# Patient Record
Sex: Male | Born: 1975
Health system: Southern US, Community
[De-identification: ages and names within clinical notes are randomized; demographics above are authoritative.]

## PROBLEM LIST (undated history)

## (undated) DIAGNOSIS — I10 Essential (primary) hypertension: Secondary | ICD-10-CM

## (undated) DIAGNOSIS — M545 Low back pain, unspecified: Secondary | ICD-10-CM

## (undated) DIAGNOSIS — T7840XA Allergy, unspecified, initial encounter: Secondary | ICD-10-CM

## (undated) DIAGNOSIS — E079 Disorder of thyroid, unspecified: Secondary | ICD-10-CM

## (undated) DIAGNOSIS — F909 Attention-deficit hyperactivity disorder, unspecified type: Secondary | ICD-10-CM

## (undated) DIAGNOSIS — E039 Hypothyroidism, unspecified: Secondary | ICD-10-CM

## (undated) DIAGNOSIS — K449 Diaphragmatic hernia without obstruction or gangrene: Secondary | ICD-10-CM

## (undated) DIAGNOSIS — E291 Testicular hypofunction: Secondary | ICD-10-CM

## (undated) DIAGNOSIS — K219 Gastro-esophageal reflux disease without esophagitis: Secondary | ICD-10-CM

## (undated) HISTORY — DX: Diaphragmatic hernia without obstruction or gangrene: K44.9

## (undated) HISTORY — DX: Allergy, unspecified, initial encounter: T78.40XA

## (undated) HISTORY — DX: Testicular hypofunction: E29.1

## (undated) HISTORY — DX: Essential (primary) hypertension: I10

## (undated) HISTORY — DX: Hypothyroidism, unspecified: E03.9

## (undated) HISTORY — DX: Low back pain: M54.5

## (undated) HISTORY — DX: Disorder of thyroid, unspecified: E07.9

## (undated) HISTORY — DX: Gastro-esophageal reflux disease without esophagitis: K21.9

## (undated) HISTORY — DX: Attention-deficit hyperactivity disorder, unspecified type: F90.9

## (undated) HISTORY — DX: Low back pain, unspecified: M54.50

---

## 2003-08-10 ENCOUNTER — Encounter: Payer: Self-pay | Admitting: Emergency Medicine

## 2003-08-10 ENCOUNTER — Emergency Department (HOSPITAL_COMMUNITY): Admission: EM | Admit: 2003-08-10 | Discharge: 2003-08-10 | Payer: Self-pay | Admitting: Emergency Medicine

## 2005-06-18 ENCOUNTER — Ambulatory Visit: Payer: Self-pay | Admitting: Family Medicine

## 2005-08-31 ENCOUNTER — Ambulatory Visit: Payer: Self-pay | Admitting: Family Medicine

## 2005-10-18 ENCOUNTER — Ambulatory Visit: Payer: Self-pay | Admitting: Family Medicine

## 2005-11-22 ENCOUNTER — Ambulatory Visit: Payer: Self-pay | Admitting: Family Medicine

## 2006-03-26 ENCOUNTER — Ambulatory Visit: Payer: Self-pay | Admitting: Family Medicine

## 2006-04-10 ENCOUNTER — Encounter: Admission: RE | Admit: 2006-04-10 | Discharge: 2006-05-14 | Payer: Self-pay | Admitting: Family Medicine

## 2006-04-10 ENCOUNTER — Ambulatory Visit: Payer: Self-pay | Admitting: Family Medicine

## 2006-06-03 ENCOUNTER — Ambulatory Visit: Payer: Self-pay | Admitting: Family Medicine

## 2006-06-05 ENCOUNTER — Encounter: Admission: RE | Admit: 2006-06-05 | Discharge: 2006-06-05 | Payer: Self-pay | Admitting: Family Medicine

## 2006-06-06 ENCOUNTER — Encounter: Admission: RE | Admit: 2006-06-06 | Discharge: 2006-06-06 | Payer: Self-pay | Admitting: Family Medicine

## 2006-06-11 ENCOUNTER — Ambulatory Visit: Payer: Self-pay | Admitting: Family Medicine

## 2006-12-18 ENCOUNTER — Ambulatory Visit: Payer: Self-pay | Admitting: Family Medicine

## 2007-04-12 ENCOUNTER — Emergency Department (HOSPITAL_COMMUNITY): Admission: EM | Admit: 2007-04-12 | Discharge: 2007-04-12 | Payer: Self-pay | Admitting: Family Medicine

## 2007-08-08 DIAGNOSIS — F411 Generalized anxiety disorder: Secondary | ICD-10-CM | POA: Insufficient documentation

## 2007-08-08 DIAGNOSIS — M545 Low back pain: Secondary | ICD-10-CM | POA: Insufficient documentation

## 2008-06-24 ENCOUNTER — Telehealth: Payer: Self-pay | Admitting: Family Medicine

## 2008-08-25 ENCOUNTER — Ambulatory Visit: Payer: Self-pay | Admitting: Family Medicine

## 2009-06-14 ENCOUNTER — Ambulatory Visit: Payer: Self-pay | Admitting: Family Medicine

## 2009-06-20 LAB — CONVERTED CEMR LAB
Alkaline Phosphatase: 62 units/L (ref 39–117)
BUN: 13 mg/dL (ref 6–23)
Basophils Relative: 0.5 % (ref 0.0–3.0)
Bilirubin, Direct: 0.1 mg/dL (ref 0.0–0.3)
CO2: 28 meq/L (ref 19–32)
Chloride: 106 meq/L (ref 96–112)
Cholesterol: 191 mg/dL (ref 0–200)
Eosinophils Absolute: 0.1 10*3/uL (ref 0.0–0.7)
Glucose, Bld: 102 mg/dL — ABNORMAL HIGH (ref 70–99)
LDL Cholesterol: 139 mg/dL — ABNORMAL HIGH (ref 0–99)
Lymphocytes Relative: 26.5 % (ref 12.0–46.0)
MCHC: 35 g/dL (ref 30.0–36.0)
Monocytes Relative: 8.8 % (ref 3.0–12.0)
Neutrophils Relative %: 62.9 % (ref 43.0–77.0)
Potassium: 3.8 meq/L (ref 3.5–5.1)
RBC: 4.72 M/uL (ref 4.22–5.81)
Specific Gravity, Urine: 1.02 (ref 1.000–1.030)
Total CHOL/HDL Ratio: 5
Total Protein, Urine: NEGATIVE mg/dL
Total Protein: 7 g/dL (ref 6.0–8.3)
Urine Glucose: NEGATIVE mg/dL
VLDL: 16.4 mg/dL (ref 0.0–40.0)
WBC: 7.6 10*3/uL (ref 4.5–10.5)

## 2009-06-28 ENCOUNTER — Ambulatory Visit: Payer: Self-pay | Admitting: Family Medicine

## 2009-06-28 DIAGNOSIS — R319 Hematuria, unspecified: Secondary | ICD-10-CM

## 2009-06-28 DIAGNOSIS — F909 Attention-deficit hyperactivity disorder, unspecified type: Secondary | ICD-10-CM | POA: Insufficient documentation

## 2009-08-01 ENCOUNTER — Ambulatory Visit: Payer: Self-pay | Admitting: Family Medicine

## 2009-08-19 ENCOUNTER — Encounter: Payer: Self-pay | Admitting: Family Medicine

## 2009-09-06 ENCOUNTER — Telehealth: Payer: Self-pay | Admitting: Family Medicine

## 2009-12-20 ENCOUNTER — Ambulatory Visit: Payer: Self-pay | Admitting: Family Medicine

## 2010-05-05 ENCOUNTER — Emergency Department (HOSPITAL_COMMUNITY): Admission: EM | Admit: 2010-05-05 | Discharge: 2010-05-05 | Payer: Self-pay | Admitting: Emergency Medicine

## 2010-05-05 ENCOUNTER — Telehealth: Payer: Self-pay | Admitting: Family Medicine

## 2010-05-15 ENCOUNTER — Ambulatory Visit: Payer: Self-pay | Admitting: Family Medicine

## 2010-05-15 DIAGNOSIS — IMO0001 Reserved for inherently not codable concepts without codable children: Secondary | ICD-10-CM

## 2010-05-15 DIAGNOSIS — R5383 Other fatigue: Secondary | ICD-10-CM

## 2010-05-15 DIAGNOSIS — R5381 Other malaise: Secondary | ICD-10-CM

## 2010-05-18 ENCOUNTER — Encounter: Payer: Self-pay | Admitting: Family Medicine

## 2010-05-18 LAB — CONVERTED CEMR LAB: EBV VCA IgG: 5.05 — ABNORMAL HIGH

## 2010-05-22 ENCOUNTER — Telehealth: Payer: Self-pay | Admitting: Family Medicine

## 2010-05-30 ENCOUNTER — Encounter: Payer: Self-pay | Admitting: Family Medicine

## 2010-07-14 ENCOUNTER — Ambulatory Visit: Payer: Self-pay | Admitting: Family Medicine

## 2010-07-14 DIAGNOSIS — E291 Testicular hypofunction: Secondary | ICD-10-CM

## 2010-07-14 DIAGNOSIS — K140 Glossitis: Secondary | ICD-10-CM

## 2010-07-20 ENCOUNTER — Encounter: Payer: Self-pay | Admitting: Family Medicine

## 2010-09-13 ENCOUNTER — Ambulatory Visit: Payer: Self-pay | Admitting: Family Medicine

## 2010-09-13 DIAGNOSIS — J069 Acute upper respiratory infection, unspecified: Secondary | ICD-10-CM

## 2010-09-13 DIAGNOSIS — M940 Chondrocostal junction syndrome [Tietze]: Secondary | ICD-10-CM

## 2010-12-07 NOTE — Progress Notes (Signed)
Summary: new Rx  Phone Note Outgoing Call   Call placed by: Raechel Ache, RN,  May 22, 2010 3:27 PM Summary of Call: report on labs. Wants to try Rx for low testosterone  [facility] [place]. Also wonders if Concerta would agree with him better than Adderall?  Follow-up for Phone Call        lets treat the low testosterone first, then work on the attention issue. Call in Androgel pump, 10 grams a day, for 6 months. recheck a level in 6 months Follow-up by: Nelwyn Salisbury MD,  May 22, 2010 5:33 PM  Additional Follow-up for Phone Call Additional follow up Details #1::        Phone call completed Additional Follow-up by: Raechel Ache, RN,  May 22, 2010 5:40 PM    New/Updated Medications: ANDROGEL PUMP 1 % GEL (TESTOSTERONE) 10gms once daily Prescriptions: ANDROGEL PUMP 1 % GEL (TESTOSTERONE) 10gms once daily  #30 days x 5   Entered by:   Raechel Ache, RN   Authorized by:   Nelwyn Salisbury MD   Signed by:   Raechel Ache, RN on 05/22/2010   Method used:   Historical   RxID:   [id number]

## 2010-12-07 NOTE — Assessment & Plan Note (Signed)
Summary: fup on meds//ccm   Vital Signs:  Patient profile:   35 year old male Weight:      170 pounds Temp:     98.3 degrees F oral Pulse rate:   84 / minute BP sitting:   110 / 82  (left arm) Cuff size:   large  Vitals Entered By: Alfred Levins, CMA (December 20, 2009 1:25 PM) CC: renew med, weak in arms   History of Present Illness: Here to follow up on ADHD. When we went to the 30 mg dose, he says this works much better for him. However he seems to hit a wall in the mid afternoon, and he feels as if the medication wears off.   Allergies: 1)  ! Erythromycin  Past History:  Past Medical History: Reviewed history from 06/28/2009 and no changes required. fractured right elbow Anxiety Low back pain ADHD hematuria, has seen a Urologist in [place]  Review of Systems  The patient denies anorexia, fever, weight loss, weight gain, vision loss, decreased hearing, hoarseness, chest pain, syncope, dyspnea on exertion, peripheral edema, prolonged cough, headaches, hemoptysis, abdominal pain, melena, hematochezia, severe indigestion/heartburn, hematuria, incontinence, genital sores, muscle weakness, suspicious skin lesions, transient blindness, difficulty walking, depression, unusual weight change, abnormal bleeding, enlarged lymph nodes, angioedema, breast masses, and testicular masses.    Physical Exam  General:  Well-developed,well-nourished,in no acute distress; alert,appropriate and cooperative throughout examination Psych:  Cognition and judgment appear intact. Alert and cooperative with normal attention span and concentration. No apparent delusions, illusions, hallucinations   Impression & Recommendations:  Problem # 1:  ADHD (ICD-314.01)  Complete Medication List: 1)  Adderall Xr 30 Mg Xr24h-cap (Amphetamine-dextroamphetamine) .... Two times a day, may fill on 02-17-10  Patient Instructions: 1)  Increase the Adderall XR to two times a day ,  2)  Please schedule a  follow-up appointment in 3 months .  Prescriptions: ADDERALL XR 30 MG XR24H-CAP (AMPHETAMINE-DEXTROAMPHETAMINE) two times a day, may fill on 02-17-10  #60 x 0   Entered and Authorized by:   Nelwyn Salisbury MD   Signed by:   Nelwyn Salisbury MD on 12/20/2009   Method used:   Print then Give to Patient   RxID:   [id number] ADDERALL XR 30 MG XR24H-CAP (AMPHETAMINE-DEXTROAMPHETAMINE) two times a day, may fill on 01-17-10  #60 x 0   Entered and Authorized by:   Nelwyn Salisbury MD   Signed by:   Nelwyn Salisbury MD on 12/20/2009   Method used:   Print then Give to Patient   RxID:   [id number] ADDERALL XR 30 MG XR24H-CAP (AMPHETAMINE-DEXTROAMPHETAMINE) two times a day  #60 x 0   Entered and Authorized by:   Nelwyn Salisbury MD   Signed by:   Nelwyn Salisbury MD on 12/20/2009   Method used:   Print then Give to Patient   RxID:   [id number] ADDERALL XR 30 MG XR24H-CAP (AMPHETAMINE-DEXTROAMPHETAMINE) once daily  #60 x 0   Entered and Authorized by:   Nelwyn Salisbury MD   Signed by:   Nelwyn Salisbury MD on 12/20/2009   Method used:   Print then Give to Patient   RxID:   [id number]

## 2010-12-07 NOTE — Assessment & Plan Note (Signed)
Summary: Post er visit/dm   Vital Signs:  Patient profile:   35 year old male Weight:      169 pounds BMI:     28.01 BP sitting:   106 / 76  (left arm) Cuff size:   regular  Vitals Entered By: Raechel Ache, RN (May 15, 2010 4:25 PM) CC: ER f/u; taking Adderall once daily.   History of Present Illness: Here to follow up after an ER visit on 05-05-10 for blurred vision, mouth sores, and generalized weakness. His workup was totally normal, including labs, EKG, head CT and brain MRI. No etiology was found, and was told to follow up with [place]. He had some mild versions of these symptoms for about 2 weeks, but they rapidly worsened during the 24 hours prior to his ER visit. Since then he has felt much better, though he still has some generalized weakness and some mild generalized myalgias. No fever or rashes. No changes in urinations or BMs. he has been on Adderall for about a year, so I do not think this relates at all to his symptoms. No chest pains or SPOB. No HAs.   Allergies: 1)  ! Erythromycin  Past History:  Past Medical History: Reviewed history from 06/28/2009 and no changes required. fractured right elbow Anxiety Low back pain ADHD hematuria, has seen a Urologist in [place]  Past Surgical History: Reviewed history from 08/08/2007 and no changes required. Denies surgical history  Review of Systems  The patient denies anorexia, fever, weight loss, weight gain, vision loss, decreased hearing, hoarseness, chest pain, syncope, dyspnea on exertion, peripheral edema, prolonged cough, headaches, hemoptysis, abdominal pain, melena, hematochezia, severe indigestion/heartburn, hematuria, incontinence, genital sores, suspicious skin lesions, transient blindness, difficulty walking, depression, unusual weight change, abnormal bleeding, enlarged lymph nodes, angioedema, breast masses, and testicular masses.    Physical Exam  General:  Well-developed,well-nourished,in no acute  distress; alert,appropriate and cooperative throughout examination Head:  Normocephalic and atraumatic without obvious abnormalities. No apparent alopecia or balding. Eyes:  No corneal or conjunctival inflammation noted. EOMI. Perrla. Funduscopic exam benign, without hemorrhages, exudates or papilledema. Vision grossly normal. Ears:  External ear exam shows no significant lesions or deformities.  Otoscopic examination reveals clear canals, tympanic membranes are intact bilaterally without bulging, retraction, inflammation or discharge. Hearing is grossly normal bilaterally. Nose:  External nasal examination shows no deformity or inflammation. Nasal mucosa are pink and moist without lesions or exudates. Mouth:  Oral mucosa and oropharynx without lesions or exudates.  Teeth in good repair. Neck:  No deformities, masses, or tenderness noted. Lungs:  Normal respiratory effort, chest expands symmetrically. Lungs are clear to auscultation, no crackles or wheezes. Heart:  Normal rate and regular rhythm. S1 and S2 normal without gallop, murmur, click, rub or other extra sounds. Abdomen:  Bowel sounds positive,abdomen soft and non-tender without masses, organomegaly or hernias noted. Extremities:  No clubbing, cyanosis, edema, or deformity noted with normal full range of motion of all joints.   Neurologic:  No cranial nerve deficits noted. Station and gait are normal. Plantar reflexes are down-going bilaterally. DTRs are symmetrical throughout. Sensory, motor and coordinative functions appear intact. Skin:  Intact without suspicious lesions or rashes Psych:  Oriented X3, memory intact for recent and remote, normally interactive, and good eye contact.     Impression & Recommendations:  Problem # 1:  WEAKNESS (ICD-780.79)  Orders: Venipuncture (16109) TLB-TSH (Thyroid Stimulating Hormone) (84443-TSH) TLB-Sedimentation Rate (ESR) (85652-ESR) TLB-Rheumatoid Factor (RA) (60454-UJ) T-Lyme Disease  (81191-47829) T-Vitamin D (  25-Hydroxy) (16109-60454) T- * Misc. Laboratory test [phone]) TLB-Testosterone, Total (84403-TESTO)  Problem # 2:  MYALGIA (ICD-729.1)  Complete Medication List: 1)  Adderall Xr 30 Mg Xr24h-cap (Amphetamine-dextroamphetamine) .... Two times a day, may fill on 02-17-10  Patient Instructions: 1)  It is still unclear what the etiology of these symptoms may be. He definitely feels better now than he did a few days ago. He wants to return to work tomorrow, and I gave him the okay to do that. We will get more labs today to investigate other possible etiologies. He will return if anything changes.   Appended Document: Orders Update    Clinical Lists Changes  Orders: Added new Test order of T- * Misc. Laboratory test [phone]) - Signed

## 2010-12-07 NOTE — Medication Information (Signed)
Summary: Androderm Approved  Androderm Approved   Imported By: Maryln Gottron 05/31/2010 12:40:37  _____________________________________________________________________  External Attachment:    Type:   Image     Comment:   External Document

## 2010-12-07 NOTE — Progress Notes (Signed)
Summary: phone  Phone Note Call from Patient   Caller: Spouse Summary of Call: wife had to pick husband up from work- weak and can't raise arms. Told to go to ER now per dr Clent Ridges. 8:30am Initial call taken by: Raechel Ache, RN,  May 05, 2010 9:33 AM

## 2010-12-07 NOTE — Consult Note (Signed)
Summary: [facility]  [facility]   Imported By: Maryln Gottron 07/28/2010 14:58:54  _____________________________________________________________________  External Attachment:    Type:   Image     Comment:   External Document

## 2010-12-07 NOTE — Assessment & Plan Note (Signed)
Summary: pneumonia?/dm   Vital Signs:  Patient profile:   35 year old male Weight:      177 pounds O2 Sat:      98 % Temp:     99 degrees F Pulse rate:   104 / minute BP sitting:   112 / 80  (left arm) Cuff size:   regular  Vitals Entered By: Pura Spice, RN (September 13, 2010 3:40 PM) CC: c/o SOB pain left lateral chest and coughing    History of Present Illness: Here for 4 days of a dry cough and some diffuse sharp pains around the chest when he takes a deep breath. No fever. No SOB. Some runny nose and sneezing. His daughter is being treated for  viral pneumonia right now.   Allergies: 1)  ! Erythromycin  Past History:  Past Medical History: Reviewed history from 07/14/2010 and no changes required. fractured right elbow Anxiety Low back pain ADHD hematuria, has seen a Urologist in [place] hypogonadism  Past Surgical History: Reviewed history from 08/08/2007 and no changes required. Denies surgical history  Review of Systems  The patient denies anorexia, fever, weight loss, weight gain, vision loss, decreased hearing, hoarseness, chest pain, syncope, dyspnea on exertion, peripheral edema, headaches, hemoptysis, abdominal pain, melena, hematochezia, severe indigestion/heartburn, hematuria, incontinence, genital sores, muscle weakness, suspicious skin lesions, transient blindness, difficulty walking, depression, unusual weight change, abnormal bleeding, enlarged lymph nodes, angioedema, breast masses, and testicular masses.    Physical Exam  General:  Well-developed,well-nourished,in no acute distress; alert,appropriate and cooperative throughout examination Head:  Normocephalic and atraumatic without obvious abnormalities. No apparent alopecia or balding. Eyes:  No corneal or conjunctival inflammation noted. EOMI. Perrla. Funduscopic exam benign, without hemorrhages, exudates or papilledema. Vision grossly normal. Ears:  External ear exam shows no significant  lesions or deformities.  Otoscopic examination reveals clear canals, tympanic membranes are intact bilaterally without bulging, retraction, inflammation or discharge. Hearing is grossly normal bilaterally. Nose:  External nasal examination shows no deformity or inflammation. Nasal mucosa are pink and moist without lesions or exudates. Mouth:  Oral mucosa and oropharynx without lesions or exudates.  Teeth in good repair. Neck:  No deformities, masses, or tenderness noted. Chest Wall:  diffusely tender around the whole chest Lungs:  Normal respiratory effort, chest expands symmetrically. Lungs are clear to auscultation, no crackles or wheezes. Heart:  Normal rate and regular rhythm. S1 and S2 normal without gallop, murmur, click, rub or other extra sounds.   Impression & Recommendations:  Problem # 1:  VIRAL URI (ICD-465.9)  Problem # 2:  COSTOCHONDRITIS (ICD-733.6)  Complete Medication List: 1)  Adderall Xr 30 Mg Xr24h-cap (Amphetamine-dextroamphetamine) .... Two times a day 2)  Androgel Pump 1 % Gel (Testosterone) .Marland Kitchen.. 10gms once daily 3)  Prednisone (pak) 10 Mg Tabs (Prednisone) .... As directed for 12 days  Patient Instructions: 1)  Please schedule a follow-up appointment as needed .  Prescriptions: PREDNISONE (PAK) 10 MG TABS (PREDNISONE) as directed for 12 days  #1 x 0   Entered and Authorized by:   Nelwyn Salisbury MD   Signed by:   Nelwyn Salisbury MD on 09/13/2010   Method used:   Electronically to        [facility]  [phone]* (retail)       [address]       [place], [place]  [address]       Ph: [phone] or [phone]  Fax: [phone]   RxID:   [id number]    Orders Added: 1)  Est. Patient Level IV [30865]

## 2010-12-07 NOTE — Assessment & Plan Note (Signed)
Summary: fup on meds---mouth issues//ccm   Vital Signs:  Patient profile:   35 year old male Weight:      171 pounds O2 Sat:      98 % Temp:     99.1 degrees F Pulse rate:   100 / minute BP sitting:   130 / 82  (left arm)  Vitals Entered By: Pura Spice, RN (July 14, 2010 11:42 AM) CC: RENEW ADDERALL AND CK MOUTH   History of Present Illness: Here to refill his Adderall, and also to discuss continuing burning pains in the tongue which have persisted for several years. This soreness in the tongue has been worse over the past few months, but no cause has been found. He saw his dentist, who could not find a cause. We recently checked numerous labs, including a ESR, which were normal. His testosterone level was low, and we have begun to supplement this, but this would not explain the sensations in his togue. He has tried [employer] and several courses of Valtrex with no results. No ST, no visible rashes or lesions.   Allergies: 1)  ! Erythromycin  Past History:  Past Medical History: fractured right elbow Anxiety Low back pain ADHD hematuria, has seen a Urologist in [place] hypogonadism  Past Surgical History: Reviewed history from 08/08/2007 and no changes required. Denies surgical history  Review of Systems  The patient denies anorexia, fever, weight loss, weight gain, vision loss, decreased hearing, hoarseness, chest pain, syncope, dyspnea on exertion, peripheral edema, prolonged cough, headaches, hemoptysis, abdominal pain, melena, hematochezia, severe indigestion/heartburn, hematuria, incontinence, genital sores, muscle weakness, suspicious skin lesions, transient blindness, difficulty walking, depression, unusual weight change, abnormal bleeding, enlarged lymph nodes, angioedema, breast masses, and testicular masses.    Physical Exam  General:  Well-developed,well-nourished,in no acute distress; alert,appropriate and cooperative throughout examination Head:   Normocephalic and atraumatic without obvious abnormalities. No apparent alopecia or balding. Eyes:  No corneal or conjunctival inflammation noted. EOMI. Perrla. Funduscopic exam benign, without hemorrhages, exudates or papilledema. Vision grossly normal. Ears:  External ear exam shows no significant lesions or deformities.  Otoscopic examination reveals clear canals, tympanic membranes are intact bilaterally without bulging, retraction, inflammation or discharge. Hearing is grossly normal bilaterally. Nose:  External nasal examination shows no deformity or inflammation. Nasal mucosa are pink and moist without lesions or exudates. Mouth:  Oral mucosa and oropharynx without lesions or exudates.  Teeth in good repair. Neck:  No deformities, masses, or tenderness noted. Neurologic:  No cranial nerve deficits noted. Station and gait are normal. Plantar reflexes are down-going bilaterally. DTRs are symmetrical throughout. Sensory, motor and coordinative functions appear intact. Cervical Nodes:  No lymphadenopathy noted Psych:  Cognition and judgment appear intact. Alert and cooperative with normal attention span and concentration. No apparent delusions, illusions, hallucinations   Impression & Recommendations:  Problem # 1:  ADHD (ICD-314.01)  Problem # 2:  HYPOGONADISM (ICD-257.2)  Problem # 3:  GLOSSITIS (ICD-529.0)  Orders: ENT Referral (ENT)  Complete Medication List: 1)  Adderall Xr 30 Mg Xr24h-cap (Amphetamine-dextroamphetamine) .... Two times a day 2)  Androgel Pump 1 % Gel (Testosterone) .Marland Kitchen.. 10gms once daily  Patient Instructions: 1)  Continue Androgel daily. Refilled Adderall as before. I cannot explain the sensations in his tongue, so we will refer to ENT.  Prescriptions: ADDERALL XR 30 MG XR24H-CAP (AMPHETAMINE-DEXTROAMPHETAMINE) two times a day  #60 x 0   Entered and Authorized by:   Nelwyn Salisbury MD   Signed by:  Nelwyn Salisbury MD on 07/14/2010   Method used:   Print then  Give to Patient   RxID:   [id number] ADDERALL XR 30 MG XR24H-CAP (AMPHETAMINE-DEXTROAMPHETAMINE) two times a day, may fill on 09-13-10  #60 x 0   Entered and Authorized by:   Nelwyn Salisbury MD   Signed by:   Nelwyn Salisbury MD on 07/14/2010   Method used:   Print then Give to Patient   RxID:   [id number] ADDERALL XR 30 MG XR24H-CAP (AMPHETAMINE-DEXTROAMPHETAMINE) two times a day, may fill on 08-13-10  #60 x 0   Entered and Authorized by:   Nelwyn Salisbury MD   Signed by:   Nelwyn Salisbury MD on 07/14/2010   Method used:   Print then Give to Patient   RxID:   [id number]

## 2010-12-18 ENCOUNTER — Encounter (INDEPENDENT_AMBULATORY_CARE_PROVIDER_SITE_OTHER): Payer: Self-pay | Admitting: *Deleted

## 2010-12-22 ENCOUNTER — Ambulatory Visit: Payer: Self-pay | Admitting: Gastroenterology

## 2010-12-27 NOTE — Letter (Signed)
Summary: New Patient letter  [facility]  [address] [place], [place] [address]   Phone: [phone]  Fax: [phone]       12/18/2010 MRN: [id number]  Patrick Brock [address] [place], [place]  [address]  Dear [name],  Welcome to the Gastroenterology Division at [facility]    You are scheduled to see Dr.  Jarold Motto on 12-22-10 at 9:30A.M. on the 3rd floor at [facility], [address]  We ask that you try to arrive at our office 15 minutes prior to your appointment time to allow for check-in.  We would like you to complete the enclosed self-administered evaluation form prior to your visit and bring it with you on the day of your appointment.  We will review it with you.  Also, please bring a complete list of all your medications or, if you prefer, bring the medication bottles and we will list them.  Please bring your insurance card so that we may make a copy of it.  If your insurance requires a referral to see a specialist, please bring your referral form from your primary care physician.  Co-payments are due at the time of your visit and may be paid by cash, check or credit card.     Your office visit will consist of a consult with your physician (includes a physical exam), any laboratory testing he/she may order, scheduling of any necessary diagnostic testing (e.g. x-ray, ultrasound, CT-scan), and scheduling of a procedure (e.g. Endoscopy, Colonoscopy) if required.  Please allow enough time on your schedule to allow for any/all of these possibilities.    If you cannot keep your appointment, please call [phone] to cancel or reschedule prior to your appointment date.  This allows [place] the opportunity to schedule an appointment for another patient in need of care.  If you do not cancel or reschedule by 5 p.m. the business day prior to your appointment date, you will be charged a $50.00 late cancellation/no-show fee.    Thank you for choosing  [facility] for your medical needs.  We appreciate the opportunity to care for you.  Please visit [place] at our website  to learn more about our practice.                     Sincerely,                                                             The Gastroenterology Division

## 2011-01-21 LAB — DIFFERENTIAL
Basophils Absolute: 0 10*3/uL (ref 0.0–0.1)
Basophils Relative: 0 % (ref 0–1)
Lymphocytes Relative: 33 % (ref 12–46)
Neutro Abs: 3.8 10*3/uL (ref 1.7–7.7)
Neutrophils Relative %: 58 % (ref 43–77)

## 2011-01-21 LAB — URINE MICROSCOPIC-ADD ON

## 2011-01-21 LAB — LIPASE, BLOOD: Lipase: 27 U/L (ref 11–59)

## 2011-01-21 LAB — URINALYSIS, ROUTINE W REFLEX MICROSCOPIC
Nitrite: NEGATIVE
Protein, ur: NEGATIVE mg/dL
Specific Gravity, Urine: 1.012 (ref 1.005–1.030)
Urobilinogen, UA: 0.2 mg/dL (ref 0.0–1.0)

## 2011-01-21 LAB — CBC
HCT: 40.1 % (ref 39.0–52.0)
Hemoglobin: 13.9 g/dL (ref 13.0–17.0)
RBC: 4.39 MIL/uL (ref 4.22–5.81)
WBC: 6.5 10*3/uL (ref 4.0–10.5)

## 2011-01-21 LAB — COMPREHENSIVE METABOLIC PANEL
BUN: 11 mg/dL (ref 6–23)
CO2: 28 mEq/L (ref 19–32)
Chloride: 107 mEq/L (ref 96–112)
Creatinine, Ser: 0.93 mg/dL (ref 0.4–1.5)
GFR calc non Af Amer: >60 mL/min (ref >60)
Glucose, Bld: 94 mg/dL (ref 70–99)
Total Bilirubin: 0.5 mg/dL (ref 0.3–1.2)

## 2011-01-21 LAB — ETHANOL: Alcohol, Ethyl (B): <5 mg/dL (ref 0–10)

## 2011-01-25 ENCOUNTER — Other Ambulatory Visit (INDEPENDENT_AMBULATORY_CARE_PROVIDER_SITE_OTHER): Payer: BC Managed Care – PPO

## 2011-01-25 ENCOUNTER — Ambulatory Visit (INDEPENDENT_AMBULATORY_CARE_PROVIDER_SITE_OTHER): Payer: BC Managed Care – PPO | Admitting: Gastroenterology

## 2011-01-25 ENCOUNTER — Encounter: Payer: Self-pay | Admitting: Gastroenterology

## 2011-01-25 VITALS — BP 124/74 | HR 88 | Ht 67.0 in | Wt 187.0 lb

## 2011-01-25 DIAGNOSIS — K219 Gastro-esophageal reflux disease without esophagitis: Secondary | ICD-10-CM

## 2011-01-25 LAB — FERRITIN: Ferritin: 31.7 ng/mL (ref 22.0–322.0)

## 2011-01-25 LAB — CBC WITH DIFFERENTIAL/PLATELET
Basophils Absolute: 0 10*3/uL (ref 0.0–0.1)
Eosinophils Relative: 1.1 % (ref 0.0–5.0)
HCT: 44 % (ref 39.0–52.0)
Lymphs Abs: 1.8 10*3/uL (ref 0.7–4.0)
MCV: 90.5 fl (ref 78.0–100.0)
Monocytes Absolute: 0.5 10*3/uL (ref 0.1–1.0)
Neutrophils Relative %: 63.3 % (ref 43.0–77.0)
Platelets: 214 10*3/uL (ref 150.0–400.0)
RDW: 12.7 % (ref 11.5–14.6)
WBC: 6.7 10*3/uL (ref 4.5–10.5)

## 2011-01-25 LAB — BASIC METABOLIC PANEL
BUN: 13 mg/dL (ref 6–23)
CO2: 26 mEq/L (ref 19–32)
Calcium: 9.1 mg/dL (ref 8.4–10.5)
Creatinine, Ser: 1 mg/dL (ref 0.4–1.5)

## 2011-01-25 LAB — HEPATIC FUNCTION PANEL
ALT: 36 U/L (ref 0–53)
Bilirubin, Direct: 0.1 mg/dL (ref 0.0–0.3)
Total Bilirubin: 0.6 mg/dL (ref 0.3–1.2)

## 2011-01-25 LAB — IBC PANEL
Iron: 111 ug/dL (ref 42–165)
Saturation Ratios: 28.4 % (ref 20.0–50.0)
Transferrin: 278.7 mg/dL (ref 212.0–360.0)

## 2011-01-25 LAB — VITAMIN B12: Vitamin B-12: 600 pg/mL (ref 211–911)

## 2011-01-25 LAB — TSH: TSH: 3.63 u[IU]/mL (ref 0.35–5.50)

## 2011-01-25 LAB — IGA: IgA: 335 mg/dL (ref 68–378)

## 2011-01-25 LAB — FOLATE: Folate: 13.6 ng/mL (ref >5.9)

## 2011-01-25 MED ORDER — ONE-DAILY MULTI VITAMINS PO TABS: 1.0000 | ORAL_TABLET | Freq: Every day | ORAL | Status: DC

## 2011-01-25 MED ORDER — DEXLANSOPRAZOLE 60 MG PO CPDR: 60.0000 mg | DELAYED_RELEASE_CAPSULE | Freq: Every day | ORAL | Status: AC

## 2011-01-25 MED ORDER — VITAMIN C 500 MG PO TABS: 500.0000 mg | ORAL_TABLET | Freq: Every day | ORAL | Status: DC

## 2011-01-25 MED ORDER — ZINC SULFATE 220 (50 ZN) MG PO CAPS: 220.0000 mg | ORAL_CAPSULE | Freq: Every day | ORAL | Status: DC

## 2011-01-25 NOTE — Progress Notes (Signed)
History of Present Illness:  This is a  35 year old Caucasian male for by Dr. Shellia Carwin  For her one-year history of atypical mouth and tongue soreness unexplained etiology despite multiple empiric treatments for fungal and viral  Infections. He has had several ENT evaluations which have been unremarkable. He does have some occasional reflux symptoms with regurgitation and burning substernal pain apparently had an upper GI series some 7-8 years ago for reasons which are unclear. No history of dysphagia, painful swallowing, or hepatobiliary complaints. He denies frequent use of antibiotics, prednisone, or any history of immune deficiency. Is not had previous endoscopic exams. Seen by urology for microscopic hematuria but he denies any symptoms of urethritis , skin rashes, or joint swellings. Lab review shows a normal rheumatoid factor. Family history is noncontributory.    he has mild constipation but does not use laxatives, 9 melena or hematochezia. There is no history of hepatitis, pancreatitis, anorexia, weight loss, or systemic complaints.He was on Adderal  Over the last year for possible ADHD. He feels this may have caused his dry mouth syndrome problems, and has discontinued this medication. He denies any anxiety or depression symptomatology. ROS: The remainder of the 8 point ROS is negative.  he does have allergic sinusitis , chronic fatigue, nonspecific headaches, and nonspecific myalgias. There is no history of any cardiopulmonary her other ENT symptomatology. Follows a regular diet and denies any specific food intolerances.   Past Medical History  Diagnosis Date  . Anxiety states   . ADHD (attention deficit hyperactivity disorder)   . Hypogonadism male   . Low back pain   . Hypertension    History reviewed. No pertinent past surgical history.  reports that he has never smoked. He does not have any smokeless tobacco history on file. He reports that he does not drink alcohol or use illicit  drugs. family history is negative for Colon cancer. Allergies  Allergen Reactions  . Erythromycin        Physical Exam: General well developed well nourished patient in no acute distress, appearing their stated age Eyes PERRLA, no icterus fundoscopic exam per opthamologist Skin no lesions noted Neck supple, no adenopathy, no thyroid enlargement, no tenderness Chest clear to percussion and auscultation Heart no significant murmurs, gallops or rubs noted Abdomen no hepatosplenomegaly masses or tenderness, BS normal.   Extremities no acute joint lesions, edema, phlebitis or evidence of cellulitis. Neurologic patient oriented x 3, cranial nerves intact, no focal neurologic deficits noted. Psychological mental status normal and normal affect.  inspection of the oropharyngeal area and tong is basically unremarkable.  assessment and plan:  I suspect he has a typical symptoms of acid reflux. Other considerations would be possible Reiter's  Syndrome , zinc , vitamin C, vitamin B deficiencies. I decided to treat him for acid reflux with Dexilant  60 mg every morning with standard antireflux maneuvers. As shown our patient education video   concerning GERD management. I have asked him to begin a multivitamin with vitamin B, 500 mg of vitamin C a day, and also a Zinc  Supplement. I have ordered screening labs including serum zinc level, magnesium level, celiac antibodies, CBC and metabolic profile. Would agree that he needs to discontinue Adderall. We'll try to obtain records concerning his ENT evaluation. Otherwise I've asked him to see me in 6 weeks' time for followup. He may need endoscopic exam and 24-hour pH probe testing.

## 2011-01-25 NOTE — Patient Instructions (Signed)
Today you have watched the Movie on [employer].  You were given a [employer] today.  Your prescription(s) have been sent to you pharmacy.  Please go to the basement today for your labs.

## 2011-01-26 ENCOUNTER — Encounter: Payer: Self-pay | Admitting: *Deleted

## 2011-01-26 DIAGNOSIS — K219 Gastro-esophageal reflux disease without esophagitis: Secondary | ICD-10-CM

## 2011-01-26 LAB — GLIA (IGA/G) + TTG IGA
Deamidated Gliadin Abs, IgG: 7.3 U/mL
Gliadin IgA: 6.2 U/mL
Tissue Transglutaminase Ab, IgA: 12.4 U/mL

## 2011-01-30 ENCOUNTER — Encounter: Payer: Self-pay | Admitting: Family Medicine

## 2011-01-30 ENCOUNTER — Ambulatory Visit (INDEPENDENT_AMBULATORY_CARE_PROVIDER_SITE_OTHER): Payer: BC Managed Care – PPO | Admitting: Family Medicine

## 2011-01-30 VITALS — BP 136/84 | HR 85 | Temp 98.5°F

## 2011-01-30 DIAGNOSIS — K121 Other forms of stomatitis: Secondary | ICD-10-CM

## 2011-01-30 DIAGNOSIS — F909 Attention-deficit hyperactivity disorder, unspecified type: Secondary | ICD-10-CM

## 2011-01-30 MED ORDER — LISDEXAMFETAMINE DIMESYLATE 40 MG PO CAPS: 40.0000 mg | ORAL_CAPSULE | ORAL | Status: DC

## 2011-01-30 NOTE — Progress Notes (Signed)
  Subjective:    Patient ID: Patrick Brock, male    DOB: [birth year], 35 y.o.   MRN: [id number]  HPI Here to discuss recurrent problems of soreness in the mouth and swollen neck nodes which have bothered him for months. As noted in the chart, he has had thorough workups with ENT and myself, and he recently saw Dr. Jarold Motto for a GI evaluation. He felt that Domanique may have some GERD problems, si he started him on Dexilant. Roney had stopped taking Adderall for several weeks to see if this could be the problem, and in fact the mouth issues resolved. Then 2 days ago he decided to try the Adderall again. Within 4 hours of taking the first dose the same mouth and throat symptoms returned. Now he has stopped it again, and the symptoms are fading a bit. Also he noticed a sharp pain in the left side of the tongue and throat yesterday which is the same today. No fever or URI symptoms.    Review of Systems  Constitutional: Negative.   HENT: Positive for sore throat.   Eyes: Negative.   Respiratory: Negative.        Objective:   Physical Exam  Constitutional: He appears well-developed and well-nourished. No distress.  HENT:  Head: Normocephalic and atraumatic.  Right Ear: External ear normal.  Left Ear: External ear normal.  Nose: Nose normal.  Mouth/Throat: No oropharyngeal exudate.       There is a large aphthous ulcer on the left side of the tongue   Eyes: Conjunctivae and EOM are normal. Pupils are equal, round, and reactive to light. Right eye exhibits no discharge. Left eye exhibits no discharge.  Neck: Normal range of motion. Neck supple. No thyromegaly present.  Lymphadenopathy:    He has no cervical adenopathy.          Assessment & Plan:  It is now obvious that Adderall is the source of a lot of his mouth and throat issues. We will stop this and try Vyvanse instead. He will follow up with me in 3-4 weeks. I explained that  He also has an aphthous ulcer that is benign and  self-limited.

## 2011-02-05 NOTE — Telephone Encounter (Signed)
Error

## 2011-02-27 ENCOUNTER — Ambulatory Visit: Payer: BC Managed Care – PPO | Admitting: Gastroenterology

## 2011-04-10 ENCOUNTER — Telehealth: Payer: Self-pay | Admitting: Gastroenterology

## 2011-04-11 ENCOUNTER — Encounter: Payer: Self-pay | Admitting: Nurse Practitioner

## 2011-04-11 ENCOUNTER — Encounter: Payer: Self-pay | Admitting: Internal Medicine

## 2011-04-11 ENCOUNTER — Ambulatory Visit (INDEPENDENT_AMBULATORY_CARE_PROVIDER_SITE_OTHER): Payer: 59 | Admitting: Nurse Practitioner

## 2011-04-11 VITALS — BP 112/74 | HR 88 | Ht 66.0 in | Wt 191.0 lb

## 2011-04-11 DIAGNOSIS — IMO0001 Reserved for inherently not codable concepts without codable children: Secondary | ICD-10-CM

## 2011-04-11 DIAGNOSIS — K140 Glossitis: Secondary | ICD-10-CM

## 2011-04-11 DIAGNOSIS — M7918 Myalgia, other site: Secondary | ICD-10-CM

## 2011-04-11 DIAGNOSIS — F909 Attention-deficit hyperactivity disorder, unspecified type: Secondary | ICD-10-CM

## 2011-04-11 NOTE — Patient Instructions (Signed)
EGD scheduled for 05/04/11 3:30 pm arrive at 2:30 pm on 4th floor.

## 2011-04-11 NOTE — Telephone Encounter (Signed)
Pt is c/o abdominal pain. Wife states that when he tries to take medication for his stomach his mouth and throat hurt. Pt requesting to be worked in today. Appt made for pt to see Willette Cluster NP today at 10:30am. Wife aware of appt date and time.

## 2011-04-11 NOTE — Progress Notes (Signed)
Patrick Brock [id number] 1976-08-27   HISTORY OR PRESENT ILLNESS :  Patient is a 35 year old white male seen in March by Dr. Jarold Motto for her atypical mouth and tongue soreness of unexplained etiology despite multiple empiric treatments for fungal and viral Infections. He has had several ENT evaluations which have been unremarkable. Multiple labs were normal including tissue transglutaminase, TSH, CBC, liver function studies, iron studies, folate, B12, CRP, magnesium, sedimentation rate, and immunoglobulin A. patient gave a history of occasional heartburn. It was felt his sore tongue may be secondary to acid reflux therefore he was started on Dexilant. Patient still he had found a definite correlation between Adderall and his symptoms. He stopped Adderall several times with resolution of his symptoms. After rechallenge in the medication a couple of times, his symptoms returned. Patient then stopped Adderall completely. Unfortunately patient had a sinus infection requiring antibiotics and his tongue became sore again. Now patient is to the point where anything he take including Tylenol or pulse the tip of his tongue to become sore. Patient denies apthous ulcers. The tip of his tongue feels raw, he occasionally sees small bumps on the underside of his tongue. He had no improvement with Dexilant and interestingly he began having increasing heartburn while on the medication.   Patrick Brock is a 4 day history of upper abdominal discomfort. He repeatedly bends over at work. He bends over a lot at home picking up toys after his children. Patient wonders if his pain is musculoskeletal.    Current Medications, Allergies, Past Medical History, Past Surgical History, Family History and Social History were reviewed in [employer] record.   PHYSICAL EXAMINATION : General: Well developed  white male in no acute distress Head: Normocephalic and atraumatic Eyes:  sclerae anicteric,conjunctive  pink. Ears: Normal auditory acuity Mouth: No deformity or lesions Neck: Supple, no masses.  Lungs: Clear throughout to auscultation Heart: Regular rate and rhythm; no murmurs heard Abdomen: Soft, nondistended, nontender. No masses or hepatomegaly noted. Normal bowel sounds Rectal: Not done Musculoskeletal: Symmetrical with no gross deformities  Skin: No lesions on visible extremities Extremities: No edema or deformities noted Neurological: Alert oriented x 4, grossly nonfocal Cervical Nodes:  No significant cervical adenopathy Psychological:  Alert and cooperative. Normal mood and affect  ASSESSMENT AND PLAN :

## 2011-04-12 ENCOUNTER — Encounter: Payer: Self-pay | Admitting: Nurse Practitioner

## 2011-04-12 DIAGNOSIS — M7918 Myalgia, other site: Secondary | ICD-10-CM | POA: Insufficient documentation

## 2011-04-12 NOTE — Assessment & Plan Note (Signed)
Patient is currently not all Adderall.

## 2011-04-12 NOTE — Assessment & Plan Note (Signed)
Ongoing for 1-1/2 years  despite topical treatment by ENT. Etiology unclear. Multiple labs including but not limited to see, magnesium, iron studies, folate and B12 by Dr. Jarold Motto in March were normal. No visible lesions on examination today. The tip of his tongue is slightly erythematous but otherwise no abnormalities. This could be related to GERD but not one of the more typical manifestations. Proton pump inhibitor did not help. Will schedule patient for an upper endoscopy for further evaluation. If there is no endoscopic evidence of GERD patient may need a 24-hour pH study. The benefits, risks, and potential complications of EGD with possible biopsies and/or dilation were discussed with the patient and she agrees to proceed.

## 2011-04-12 NOTE — Assessment & Plan Note (Signed)
Diffuse upper abdominal pain, worse with bending. Patient's job requires repeated bending. His pain definitely sounds musculoskeletal but he will be evaluated for peptic ulcer disease at the time of upper endoscopy which we are doing for evaluation of ongoing glossitis.

## 2011-04-13 NOTE — Progress Notes (Signed)
Reviewed and agree with EGD,

## 2011-05-04 ENCOUNTER — Encounter: Payer: Self-pay | Admitting: Gastroenterology

## 2011-05-04 ENCOUNTER — Ambulatory Visit (AMBULATORY_SURGERY_CENTER): Payer: 59 | Admitting: Gastroenterology

## 2011-05-04 VITALS — BP 111/68 | HR 82 | Temp 97.6°F | Resp 26 | Ht 67.0 in | Wt 185.0 lb

## 2011-05-04 DIAGNOSIS — K146 Glossodynia: Secondary | ICD-10-CM

## 2011-05-04 DIAGNOSIS — R109 Unspecified abdominal pain: Secondary | ICD-10-CM

## 2011-05-04 DIAGNOSIS — K209 Esophagitis, unspecified without bleeding: Secondary | ICD-10-CM

## 2011-05-04 DIAGNOSIS — K219 Gastro-esophageal reflux disease without esophagitis: Secondary | ICD-10-CM

## 2011-05-04 DIAGNOSIS — D1330 Benign neoplasm of unspecified part of small intestine: Secondary | ICD-10-CM

## 2011-05-04 DIAGNOSIS — K140 Glossitis: Secondary | ICD-10-CM

## 2011-05-04 MED ORDER — SODIUM CHLORIDE 0.9 % IV SOLN: 500.0000 mL | INTRAVENOUS | Status: DC

## 2011-05-04 NOTE — Patient Instructions (Signed)
Discharge instructions given with verbal understanding. Handouts on esophagitis and a soft diet given. Resume previous medications.

## 2011-05-07 ENCOUNTER — Telehealth: Payer: Self-pay | Admitting: *Deleted

## 2011-05-07 DIAGNOSIS — K219 Gastro-esophageal reflux disease without esophagitis: Secondary | ICD-10-CM

## 2011-05-07 LAB — HELICOBACTER PYLORI SCREEN-BIOPSY: UREASE: NEGATIVE

## 2011-05-07 NOTE — Telephone Encounter (Signed)
Message left on identifier phone.

## 2011-05-08 ENCOUNTER — Telehealth: Payer: Self-pay | Admitting: *Deleted

## 2011-05-08 NOTE — Telephone Encounter (Signed)
Per Dr Jarold Motto, scheduled pt to see him for a 1 month f/u on 06/05/11 at 0845am.

## 2011-05-13 ENCOUNTER — Encounter: Payer: Self-pay | Admitting: Gastroenterology

## 2011-05-15 ENCOUNTER — Telehealth: Payer: Self-pay | Admitting: Gastroenterology

## 2011-05-15 NOTE — Telephone Encounter (Signed)
Advised he would get a letter but bx are ok he should remain on PPI he states that he does not think Aciphex is helping but he has some samples of other PPIs and he will try those too. I will give samples if he wouldlike to try other ppis but we can discuss at his office visit 06/05/2011

## 2011-05-18 ENCOUNTER — Telehealth: Payer: Self-pay | Admitting: Gastroenterology

## 2011-05-18 MED ORDER — ESOMEPRAZOLE MAGNESIUM 40 MG PO CPDR: 40.0000 mg | DELAYED_RELEASE_CAPSULE | Freq: Every day | ORAL | Status: DC

## 2011-05-18 NOTE — Telephone Encounter (Signed)
Spoke with patient's wife and he is out of PPI samples. He would like to try the Nexium since the Aciphex did not help. Nexium samples up front for pick up

## 2011-05-31 ENCOUNTER — Encounter: Payer: Self-pay | Admitting: *Deleted

## 2011-06-05 ENCOUNTER — Ambulatory Visit (INDEPENDENT_AMBULATORY_CARE_PROVIDER_SITE_OTHER): Payer: 59 | Admitting: Gastroenterology

## 2011-06-05 ENCOUNTER — Encounter: Payer: Self-pay | Admitting: Gastroenterology

## 2011-06-05 VITALS — BP 116/74 | HR 80 | Ht 66.0 in | Wt 189.0 lb

## 2011-06-05 DIAGNOSIS — K146 Glossodynia: Secondary | ICD-10-CM | POA: Insufficient documentation

## 2011-06-05 DIAGNOSIS — K219 Gastro-esophageal reflux disease without esophagitis: Secondary | ICD-10-CM

## 2011-06-05 NOTE — Patient Instructions (Signed)
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

## 2011-06-05 NOTE — Progress Notes (Signed)
This is a 35 year old Caucasian male with continued burning dysesthesia of his tongue. Endoscopy showed erosive esophagitis, and biopsies were unremarkable without evidence of eosinophilic esophagitis. Patient denies chronic GERD symptoms, has been on Nexium 40 mg twice a day without improvement. Review of his record showed a negative head CT scan one year ago. He denies other neurological or general medical problems. Labs show normal CBC, metabolic profile, CRP and sed rate, negative serologic studies for celiac disease, and normal iron, folate, and B12 levels. The patient denies dysphagia or painful swallowing. Also denies gait disturbances, dizziness, ataxia, or any neuromuscular problems. He apparently was seen at an outpatient emergency care center and placed on Lexapro 10 mg a day for 2 weeks without any improvement. The patient denies any psychiatric symptomatology. He has had previous ENT consultation which was unremarkable.  Current Medications, Allergies, Past Medical History, Past Surgical History, Family History and Social History were reviewed in [employer] record.  Pertinent Review of Systems Negative   Physical Exam: Examination of the tongue area and oropharynx is unremarkable.   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 I have referred him to the Pain Clinic for evaluation and possible institution of tricyclic  antidepressants, gabapentin, Tegretol, et Karie Soda. I have decreased his Nexium to 40 mg every morning, and advised him to stop Lexapro. No diagnosis found. Please copy her primary care physician, referring physician, and pertinent subspecialists.

## 2011-06-15 ENCOUNTER — Telehealth: Payer: Self-pay | Admitting: Gastroenterology

## 2011-06-15 MED ORDER — ESOMEPRAZOLE MAGNESIUM 40 MG PO CPDR: 40.0000 mg | DELAYED_RELEASE_CAPSULE | Freq: Every day | ORAL | Status: DC

## 2011-06-15 NOTE — Telephone Encounter (Signed)
rx sent wife aware 

## 2011-07-06 ENCOUNTER — Telehealth: Payer: Self-pay | Admitting: *Deleted

## 2011-07-06 DIAGNOSIS — G500 Trigeminal neuralgia: Secondary | ICD-10-CM

## 2011-07-06 NOTE — Telephone Encounter (Signed)
Called [facility] and they have pt schedule for 07/11/2011 at 11:30am.

## 2011-08-08 ENCOUNTER — Other Ambulatory Visit (HOSPITAL_COMMUNITY): Payer: Self-pay | Admitting: Specialist

## 2011-08-08 DIAGNOSIS — M545 Low back pain, unspecified: Secondary | ICD-10-CM

## 2011-08-15 ENCOUNTER — Ambulatory Visit (HOSPITAL_COMMUNITY)
Admission: RE | Admit: 2011-08-15 | Discharge: 2011-08-15 | Disposition: A | Discharge status: Home / Self Care | Payer: 59 | Source: Ambulatory Visit | Attending: Specialist | Admitting: Specialist

## 2011-08-15 DIAGNOSIS — M5126 Other intervertebral disc displacement, lumbar region: Secondary | ICD-10-CM | POA: Insufficient documentation

## 2011-08-15 DIAGNOSIS — M545 Low back pain: Secondary | ICD-10-CM

## 2011-08-20 ENCOUNTER — Other Ambulatory Visit (HOSPITAL_COMMUNITY): Payer: Self-pay | Admitting: Specialist

## 2011-08-20 DIAGNOSIS — M542 Cervicalgia: Secondary | ICD-10-CM

## 2011-08-27 ENCOUNTER — Ambulatory Visit (HOSPITAL_COMMUNITY)
Admission: RE | Admit: 2011-08-27 | Discharge: 2011-08-27 | Disposition: A | Discharge status: Home / Self Care | Payer: 59 | Source: Ambulatory Visit | Attending: Specialist | Admitting: Specialist

## 2011-08-27 DIAGNOSIS — M542 Cervicalgia: Secondary | ICD-10-CM

## 2011-08-27 DIAGNOSIS — M4802 Spinal stenosis, cervical region: Secondary | ICD-10-CM | POA: Insufficient documentation

## 2011-10-10 ENCOUNTER — Telehealth: Payer: Self-pay | Admitting: Family Medicine

## 2011-10-10 NOTE — Telephone Encounter (Signed)
Refill Vyvanse. Thanks. °

## 2011-10-11 ENCOUNTER — Telehealth: Payer: Self-pay | Admitting: Family Medicine

## 2011-10-11 MED ORDER — LISDEXAMFETAMINE DIMESYLATE 40 MG PO CAPS: 40.0000 mg | ORAL_CAPSULE | ORAL | Status: DC

## 2011-10-11 NOTE — Telephone Encounter (Signed)
done

## 2011-10-11 NOTE — Telephone Encounter (Signed)
Script is ready for pick up and pt aware. 

## 2011-10-11 NOTE — Telephone Encounter (Signed)
Pts wife called to req status of getting pts script refilled for Vyvanse. Pt is completely out of med.  Pls call when ready for pick up.

## 2012-03-03 ENCOUNTER — Other Ambulatory Visit: Payer: Self-pay | Admitting: Family Medicine

## 2012-03-03 NOTE — Telephone Encounter (Signed)
Pt needs new rx vyvanse. Pt is out. Pt wife is aware MD out of office until tomorrow.

## 2012-03-05 MED ORDER — LISDEXAMFETAMINE DIMESYLATE 40 MG PO CAPS: 40.0000 mg | ORAL_CAPSULE | ORAL | Status: DC

## 2012-03-05 NOTE — Telephone Encounter (Signed)
done

## 2012-03-05 NOTE — Telephone Encounter (Signed)
Script ready for pick up and spoke with pt. 

## 2012-04-23 ENCOUNTER — Telehealth: Payer: Self-pay | Admitting: Family Medicine

## 2012-04-23 MED ORDER — LISDEXAMFETAMINE DIMESYLATE 40 MG PO CAPS: 40.0000 mg | ORAL_CAPSULE | ORAL | Status: DC

## 2012-04-23 NOTE — Telephone Encounter (Signed)
Pt filled February Vyvanse Rx, but did not fill till Mar 09, 2012. In the interim, has misplaced the other 2 rx's. Wife is calling wanting to know if we will give him new ones for those two. I told her I was unsure. Please check with MD and call back. Thank you.

## 2012-04-23 NOTE — Telephone Encounter (Signed)
Script is ready and left message.

## 2012-04-23 NOTE — Telephone Encounter (Signed)
Done for one month at a time  

## 2012-08-05 ENCOUNTER — Telehealth: Payer: Self-pay | Admitting: Family Medicine

## 2012-08-05 MED ORDER — LISDEXAMFETAMINE DIMESYLATE 40 MG PO CAPS: 40.0000 mg | ORAL_CAPSULE | ORAL | Status: DC

## 2012-08-05 NOTE — Telephone Encounter (Signed)
Script is ready for pick and I left a message with the below information.

## 2012-08-05 NOTE — Telephone Encounter (Signed)
Patient's spouse called stating that he need a refill of his vyvanse. Please assist.

## 2012-08-05 NOTE — Telephone Encounter (Signed)
I wrote for one month supply only. He needs an OV for any more

## 2012-08-19 ENCOUNTER — Ambulatory Visit (INDEPENDENT_AMBULATORY_CARE_PROVIDER_SITE_OTHER): Payer: 59 | Admitting: Family Medicine

## 2012-08-19 ENCOUNTER — Encounter: Payer: Self-pay | Admitting: Family Medicine

## 2012-08-19 VITALS — BP 110/70 | HR 92 | Temp 99.2°F | Wt 195.0 lb

## 2012-08-19 DIAGNOSIS — F909 Attention-deficit hyperactivity disorder, unspecified type: Secondary | ICD-10-CM

## 2012-08-19 DIAGNOSIS — Z9109 Other allergy status, other than to drugs and biological substances: Secondary | ICD-10-CM

## 2012-08-19 DIAGNOSIS — Z23 Encounter for immunization: Secondary | ICD-10-CM

## 2012-08-19 MED ORDER — AMPHETAMINE-DEXTROAMPHET ER 30 MG PO CP24: 30.0000 mg | ORAL_CAPSULE | Freq: Two times a day (BID) | ORAL | Status: DC

## 2012-08-19 NOTE — Progress Notes (Signed)
  Subjective:    Patient ID: Patrick Brock, male    DOB: 1976-08-01, 36 y.o.   MRN: [id number]  HPI Here to follow up on mouth burning and ADHD. He wants to go back on Adderall XR instead of Vyvanse. He asks for a referral to Allergy to see if the mouth burning is related to allergies.    Review of Systems  Constitutional: Negative.   Neurological: Negative.   Psychiatric/Behavioral: Negative.        Objective:   Physical Exam  Constitutional: He appears well-developed and well-nourished.  HENT:  Right Ear: External ear normal.  Left Ear: External ear normal.  Nose: Nose normal.  Mouth/Throat: Oropharynx is clear and moist. No oropharyngeal exudate.  Eyes: Conjunctivae normal are normal.  Neck: Neck supple. No thyromegaly present.  Lymphadenopathy:    He has no cervical adenopathy.  Psychiatric: He has a normal mood and affect. His behavior is normal. Thought content normal.          Assessment & Plan:  Switch from Vyvanse back to Adderall XR 30 mg bid. Refer to Allergy.

## 2012-08-27 ENCOUNTER — Telehealth: Payer: Self-pay | Admitting: Family Medicine

## 2012-08-27 NOTE — Telephone Encounter (Signed)
Refill request for Adderrall XR 30 mg take 1 po bid and last here on 08/09/12. ( [facility] )

## 2012-08-28 MED ORDER — AMPHETAMINE-DEXTROAMPHET ER 30 MG PO CP24: 30.0000 mg | ORAL_CAPSULE | Freq: Two times a day (BID) | ORAL | Status: DC

## 2012-08-28 NOTE — Addendum Note (Signed)
Addended by: Gershon Crane A on: 08/28/2012 01:00 PM   Modules accepted: Orders

## 2012-08-28 NOTE — Telephone Encounter (Signed)
done

## 2013-05-13 ENCOUNTER — Telehealth: Payer: Self-pay | Admitting: Family Medicine

## 2013-05-13 MED ORDER — AMPHETAMINE-DEXTROAMPHET ER 30 MG PO CP24: 30.0000 mg | ORAL_CAPSULE | Freq: Two times a day (BID) | ORAL | Status: DC

## 2013-05-13 NOTE — Telephone Encounter (Signed)
Pt needs new rx generic adderall xr 30 mg. Per wife pt may have lost one rx

## 2013-05-13 NOTE — Telephone Encounter (Signed)
Pt was last seen on 08/19/12, only requesting 30 day supply.

## 2013-05-13 NOTE — Telephone Encounter (Signed)
Script is ready for pick and I left message for pt.

## 2013-05-13 NOTE — Telephone Encounter (Signed)
It appears he has not been on medication refill  Last for December.    Please advise is he getting the med elsewhere  i fnot can give 30   Pills   Until dr fry back in office for further management.

## 2013-06-23 ENCOUNTER — Encounter: Payer: Self-pay | Admitting: Family Medicine

## 2013-06-23 ENCOUNTER — Ambulatory Visit (INDEPENDENT_AMBULATORY_CARE_PROVIDER_SITE_OTHER): Payer: 59 | Admitting: Family Medicine

## 2013-06-23 VITALS — BP 118/84 | HR 74 | Temp 98.5°F | Wt 192.0 lb

## 2013-06-23 DIAGNOSIS — F909 Attention-deficit hyperactivity disorder, unspecified type: Secondary | ICD-10-CM

## 2013-06-23 DIAGNOSIS — K219 Gastro-esophageal reflux disease without esophagitis: Secondary | ICD-10-CM

## 2013-06-23 MED ORDER — ESOMEPRAZOLE MAGNESIUM 40 MG PO CPDR: 40.0000 mg | DELAYED_RELEASE_CAPSULE | Freq: Every day | ORAL | Status: DC

## 2013-06-23 MED ORDER — AMPHETAMINE-DEXTROAMPHET ER 30 MG PO CP24: 30.0000 mg | ORAL_CAPSULE | Freq: Two times a day (BID) | ORAL | Status: DC

## 2013-06-23 NOTE — Progress Notes (Signed)
  Subjective:    Patient ID: Patrick Brock, male    DOB: 1976-08-15, 37 y.o.   MRN: [id number]  HPI Here for follow up. He feels good and is pleased with Adderall. It works well and he has no side effects. He typically does not take it every day.    Review of Systems  Constitutional: Negative.   Respiratory: Negative.   Cardiovascular: Negative.   Neurological: Negative.        Objective:   Physical Exam  Constitutional: He is oriented to person, place, and time. He appears well-developed and well-nourished.  Neck: No thyromegaly present.  Cardiovascular: Normal rate, regular rhythm, normal heart sounds and intact distal pulses.   Pulmonary/Chest: Effort normal and breath sounds normal.  Lymphadenopathy:    He has no cervical adenopathy.  Neurological: He is alert and oriented to person, place, and time.          Assessment & Plan:  Doing well. Refilled meds.

## 2013-06-29 ENCOUNTER — Ambulatory Visit: Payer: 59 | Admitting: Family Medicine

## 2013-11-30 ENCOUNTER — Telehealth: Payer: Self-pay | Admitting: Family Medicine

## 2013-11-30 MED ORDER — AMPHETAMINE-DEXTROAMPHET ER 30 MG PO CP24: 30.0000 mg | ORAL_CAPSULE | Freq: Two times a day (BID) | ORAL | Status: DC

## 2013-11-30 NOTE — Telephone Encounter (Signed)
Script is ready for pick up, contract printed and I tried to reach pt, no answer.

## 2013-11-30 NOTE — Telephone Encounter (Signed)
Pt request rx amphetamine-dextroamphetamine (ADDERALL XR) 30 MG 24 hr capsule 1/bid 3 mo supply

## 2013-11-30 NOTE — Telephone Encounter (Signed)
Done for one month only. He needs testing and a contract

## 2013-12-28 ENCOUNTER — Encounter: Payer: Self-pay | Admitting: Family Medicine

## 2014-01-27 ENCOUNTER — Telehealth: Payer: Self-pay | Admitting: Family Medicine

## 2014-01-27 NOTE — Telephone Encounter (Signed)
Pt is needing new rx amphetamine-dextroamphetamine (ADDERALL XR) 30 MG 24 hr capsule, please call when available for pick up

## 2014-02-01 MED ORDER — AMPHETAMINE-DEXTROAMPHET ER 30 MG PO CP24: 30.0000 mg | ORAL_CAPSULE | Freq: Two times a day (BID) | ORAL | Status: DC

## 2014-02-01 NOTE — Telephone Encounter (Signed)
done

## 2014-02-01 NOTE — Telephone Encounter (Signed)
Script is ready for pick up and pt is here now.  

## 2014-05-25 ENCOUNTER — Telehealth: Payer: Self-pay | Admitting: Family Medicine

## 2014-05-25 NOTE — Telephone Encounter (Signed)
Pt needs new rx generic adderall xr 30 mg °

## 2014-05-26 MED ORDER — AMPHETAMINE-DEXTROAMPHET ER 30 MG PO CP24: 30.0000 mg | ORAL_CAPSULE | Freq: Two times a day (BID) | ORAL | Status: DC

## 2014-05-26 NOTE — Telephone Encounter (Signed)
Done for one month only. He needs an OV soon

## 2014-05-26 NOTE — Telephone Encounter (Signed)
Script is ready for pick up and I spoke with pt.  

## 2014-07-08 ENCOUNTER — Telehealth: Payer: Self-pay | Admitting: Family Medicine

## 2014-07-08 NOTE — Telephone Encounter (Signed)
Pt request refill of the following: amphetamine-dextroamphetamine (ADDERALL XR) 30 MG 24 hr capsule    Phamacy:   Pick up

## 2014-07-09 NOTE — Telephone Encounter (Signed)
Can you call pt to schedule a office visit? 

## 2014-07-09 NOTE — Telephone Encounter (Signed)
NO refills until he has an OV

## 2014-07-16 ENCOUNTER — Encounter: Payer: Self-pay | Admitting: Family Medicine

## 2014-07-16 ENCOUNTER — Ambulatory Visit (INDEPENDENT_AMBULATORY_CARE_PROVIDER_SITE_OTHER): Payer: 59 | Admitting: Family Medicine

## 2014-07-16 VITALS — BP 126/73 | HR 81 | Temp 98.7°F | Ht 66.0 in | Wt 178.0 lb

## 2014-07-16 DIAGNOSIS — F909 Attention-deficit hyperactivity disorder, unspecified type: Secondary | ICD-10-CM

## 2014-07-16 MED ORDER — AMPHETAMINE-DEXTROAMPHET ER 30 MG PO CP24: 30.0000 mg | ORAL_CAPSULE | Freq: Two times a day (BID) | ORAL | Status: DC

## 2014-07-16 NOTE — Progress Notes (Signed)
Pre visit review using our clinic review tool, if applicable. No additional management support is needed unless otherwise documented below in the visit note. 

## 2014-07-16 NOTE — Progress Notes (Signed)
   Subjective:    Patient ID: Patrick Brock, male    DOB: 1976-04-09, 38 y.o.   MRN: [id number]  HPI Here to follow up on ADHD. He is still very happy with the Adderall and it works well for him. No side effects.    Review of Systems  Constitutional: Negative.   Respiratory: Negative.   Cardiovascular: Negative.   Neurological: Negative.        Objective:   Physical Exam  Constitutional: He is oriented to person, place, and time. He appears well-developed and well-nourished.  Cardiovascular: Normal rate, regular rhythm, normal heart sounds and intact distal pulses.   Pulmonary/Chest: Effort normal and breath sounds normal.  Neurological: He is alert and oriented to person, place, and time.          Assessment & Plan:  He is doing well. Meds were refilled.

## 2014-10-28 ENCOUNTER — Telehealth: Payer: Self-pay | Admitting: Family Medicine

## 2014-10-28 MED ORDER — AMPHETAMINE-DEXTROAMPHET ER 30 MG PO CP24: 30.0000 mg | ORAL_CAPSULE | Freq: Two times a day (BID) | ORAL | Status: DC

## 2014-10-28 NOTE — Telephone Encounter (Signed)
done

## 2014-10-28 NOTE — Telephone Encounter (Signed)
Pt needs new rx generic adderall xr 30 mg °

## 2014-10-28 NOTE — Telephone Encounter (Signed)
Script is ready for pick up and left a message.  

## 2015-04-14 ENCOUNTER — Other Ambulatory Visit: Payer: Self-pay | Admitting: Family Medicine

## 2015-04-14 MED ORDER — AMPHETAMINE-DEXTROAMPHET ER 30 MG PO CP24: 30.0000 mg | ORAL_CAPSULE | Freq: Two times a day (BID) | ORAL | Status: DC

## 2015-04-14 NOTE — Telephone Encounter (Signed)
Pt needs new rx generic adderall xr 20 mg. Pt took last pill today. Pt wife is aware md half day today

## 2015-04-14 NOTE — Telephone Encounter (Signed)
done

## 2015-04-14 NOTE — Telephone Encounter (Signed)
Called and spoke with pt and pt is aware rx is ready for pick up.  

## 2015-08-31 ENCOUNTER — Ambulatory Visit (INDEPENDENT_AMBULATORY_CARE_PROVIDER_SITE_OTHER): Payer: 59 | Admitting: Family Medicine

## 2015-08-31 ENCOUNTER — Encounter: Payer: Self-pay | Admitting: Family Medicine

## 2015-08-31 VITALS — BP 122/78 | HR 82 | Temp 98.5°F | Ht 66.0 in | Wt 189.0 lb

## 2015-08-31 DIAGNOSIS — M25511 Pain in right shoulder: Secondary | ICD-10-CM

## 2015-08-31 DIAGNOSIS — M25521 Pain in right elbow: Secondary | ICD-10-CM | POA: Diagnosis not present

## 2015-08-31 DIAGNOSIS — M542 Cervicalgia: Secondary | ICD-10-CM

## 2015-08-31 MED ORDER — METHYLPREDNISOLONE ACETATE 80 MG/ML IJ SUSP
120.0000 mg | Freq: Once | INTRAMUSCULAR | Status: AC
  Administered 2015-08-31: 120 mg via INTRAMUSCULAR

## 2015-08-31 MED ORDER — DICLOFENAC SODIUM 75 MG PO TBEC: 75.0000 mg | DELAYED_RELEASE_TABLET | Freq: Two times a day (BID) | ORAL | Status: DC

## 2015-08-31 NOTE — Progress Notes (Signed)
Pre visit review using our clinic review tool, if applicable. No additional management support is needed unless otherwise documented below in the visit note. 

## 2015-08-31 NOTE — Progress Notes (Signed)
   Subjective:    Patient ID: Patrick Brock, male    DOB: 05-07-1976, 39 y.o.   MRN: [id number]  HPI Here for 3 weeks of stiffness and pain in the right elbow, right shoulder, and posterior neck. No recent trauma. He is using ice, heat, and Ibuprofen with mixed results.    Review of Systems  Constitutional: Negative.   Respiratory: Negative.   Cardiovascular: Negative.   Musculoskeletal: Positive for arthralgias, neck pain and neck stiffness. Negative for back pain and joint swelling.  Neurological: Negative.        Objective:   Physical Exam  Constitutional: He appears well-developed and well-nourished. No distress.  Cardiovascular: Normal rate, regular rhythm, normal heart sounds and intact distal pulses.   Pulmonary/Chest: Effort normal and breath sounds normal.  Musculoskeletal:  He is tender over the posterior neck over the area of C4 to C7. There is some spasm and limitation of ROM. He is tender over the anterior right shoudler with very limited ROM due to pain. There is a lot of crepitus. He is also tender over the lateral and medial epicondyles of the right elbow. No swelling or warmth.           Assessment & Plan:  He seems to have epicondylitis, rotator cuff tendonitis, and degenerative disc disease of the cervical spine. It is not clear why all this flared up at the same time. He is given a sot of steroids today and he wil start on Diclofenac bid. Recheck if not better in one week.

## 2015-08-31 NOTE — Addendum Note (Signed)
Addended by: Aggie Hacker A on: 08/31/2015 11:55 AM   Modules accepted: Orders

## 2015-11-08 ENCOUNTER — Telehealth: Payer: Self-pay | Admitting: Family Medicine

## 2015-11-08 NOTE — Telephone Encounter (Signed)
Ok to refill 

## 2015-11-08 NOTE — Telephone Encounter (Signed)
Pt's wife called requesting a refill on the pt's Adderall medication. Please advise.

## 2015-11-09 MED ORDER — AMPHETAMINE-DEXTROAMPHET ER 30 MG PO CP24: 30.0000 mg | ORAL_CAPSULE | Freq: Two times a day (BID) | ORAL | Status: DC

## 2015-11-09 NOTE — Telephone Encounter (Signed)
Patient notified that Rx is ready for pick up.

## 2015-11-09 NOTE — Telephone Encounter (Signed)
done

## 2015-11-11 MED FILL — DEXTROAMP-AMPHET ER 30 MG C: 30 | 30 days supply | Qty: 60 | Fill #0

## 2015-12-14 MED FILL — DEXTROAMP-AMPHET ER 30 MG C: 30 | 30 days supply | Qty: 60 | Fill #0

## 2016-01-13 ENCOUNTER — Ambulatory Visit (INDEPENDENT_AMBULATORY_CARE_PROVIDER_SITE_OTHER): Payer: 59 | Admitting: Family Medicine

## 2016-01-13 ENCOUNTER — Encounter: Payer: Self-pay | Admitting: Family Medicine

## 2016-01-13 VITALS — BP 128/71 | HR 77 | Temp 98.7°F | Ht 66.0 in | Wt 187.0 lb

## 2016-01-13 DIAGNOSIS — J019 Acute sinusitis, unspecified: Secondary | ICD-10-CM | POA: Diagnosis not present

## 2016-01-13 MED ORDER — AZITHROMYCIN 250 MG PO TABS: ORAL_TABLET | ORAL | Status: DC

## 2016-01-13 MED FILL — AZITHROMYCIN 250 MG TABLET: 250 | 5 days supply | Qty: 6 | Fill #0

## 2016-01-13 NOTE — Progress Notes (Signed)
Pre visit review using our clinic review tool, if applicable. No additional management support is needed unless otherwise documented below in the visit note. 

## 2016-01-13 NOTE — Progress Notes (Signed)
   Subjective:    Patient ID: Patrick Brock, male    DOB: 05-02-1976, 40 y.o.   MRN: [id number]  HPI Here for one week of sinus pressure, PND, hoarseness and a dry cough. No fever.    Review of Systems  Constitutional: Negative.   HENT: Positive for congestion, postnasal drip, sinus pressure and voice change. Negative for sore throat.   Eyes: Negative.   Respiratory: Positive for cough.        Objective:   Physical Exam  Constitutional: He appears well-developed and well-nourished.  HENT:  Right Ear: External ear normal.  Left Ear: External ear normal.  Nose: Nose normal.  Mouth/Throat: Oropharynx is clear and moist.  Eyes: Conjunctivae are normal.  Neck: No thyromegaly present.  Pulmonary/Chest: Effort normal and breath sounds normal.  Hoarse voice   Lymphadenopathy:    He has no cervical adenopathy.          Assessment & Plan:  Sinusitis, treat with a Zpack

## 2016-01-20 MED FILL — DEXTROAMP-AMPHET ER 30 MG C: 30 | 30 days supply | Qty: 60 | Fill #0

## 2016-02-24 ENCOUNTER — Telehealth: Payer: Self-pay | Admitting: Family Medicine

## 2016-02-24 MED ORDER — AMPHETAMINE-DEXTROAMPHET ER 30 MG PO CP24: 30.0000 mg | ORAL_CAPSULE | Freq: Two times a day (BID) | ORAL | Status: DC

## 2016-02-24 MED FILL — DEXTROAMP-AMPHET ER 30 MG C: 30 | 30 days supply | Qty: 60 | Fill #0

## 2016-02-24 NOTE — Telephone Encounter (Signed)
done

## 2016-02-24 NOTE — Telephone Encounter (Signed)
Voicemail was left for pt. Letting him know that Rx is ready for pick up

## 2016-02-24 NOTE — Telephone Encounter (Signed)
Pt need refill on Rx Adderall

## 2016-04-05 MED FILL — DEXTROAMP-AMPHET ER 30 MG C: 30 | 30 days supply | Qty: 60 | Fill #0

## 2016-05-10 MED FILL — DEXTROAMP-AMPHET ER 30 MG C: 30 | 30 days supply | Qty: 60 | Fill #0

## 2016-06-12 ENCOUNTER — Emergency Department
Admission: EM | Admit: 2016-06-12 | Discharge: 2016-06-12 | Disposition: A | Discharge status: Other Acute Inpatient Hospital | Payer: 59 | Source: Home / Self Care | Attending: Emergency Medicine | Admitting: Emergency Medicine

## 2016-06-12 ENCOUNTER — Telehealth: Payer: Self-pay | Admitting: Family Medicine

## 2016-06-12 ENCOUNTER — Encounter: Payer: Self-pay | Admitting: *Deleted

## 2016-06-12 DIAGNOSIS — R0789 Other chest pain: Secondary | ICD-10-CM | POA: Diagnosis not present

## 2016-06-12 NOTE — Discharge Instructions (Signed)
Go to the Emergency department for evaluation  

## 2016-06-12 NOTE — ED Triage Notes (Signed)
Pt c/o generalized CP, dizziness, back pain, HA and nausea after doing yard work 3 days ago. He reports minimal relief with IBF.

## 2016-06-12 NOTE — Telephone Encounter (Signed)
Pt request refill  amphetamine-dextroamphetamine (ADDERALL XR) 30 MG 24  3 mo supply

## 2016-06-13 ENCOUNTER — Telehealth: Payer: Self-pay | Admitting: *Deleted

## 2016-06-13 MED ORDER — AMPHETAMINE-DEXTROAMPHET ER 30 MG PO CP24: 30.0000 mg | ORAL_CAPSULE | Freq: Two times a day (BID) | ORAL | 0 refills | Status: DC

## 2016-06-13 NOTE — Telephone Encounter (Signed)
Script is ready for pick up here at front office and I left a message for pt.  

## 2016-06-13 NOTE — Telephone Encounter (Signed)
done

## 2016-06-13 NOTE — Telephone Encounter (Signed)
Pt reports that he went home last night and took tylenol with some relief. He reports that he feels better today. He did not go to the ED last night. He states that he will f/u with his PCP and schedule a stress test. Charna Archer, LPN

## 2016-06-13 NOTE — ED Provider Notes (Signed)
CSN: [id number]     Arrival date & time 06/12/16  1933 History   None    Chief Complaint  Patient presents with  . Chest Pain   (Consider location/radiation/quality/duration/timing/severity/associated sxs/prior Treatment) Pt reports he worked in the yard for about 12 hours on Saturday.  Pt reports he became hot and began having muscle cramps.  Pt reports he drank fluids.  Pt reports he has been drinking plenty of fluids.  He continues to feel dizzy and has pain in his chest and back.  Pt reports he thinks pain is from overdoing it.  Pt reports he felt nauseated and dizzy yesterday and today. No vomiting, no sweating   The history is provided by the patient. No language interpreter was used.  Chest Pain  Pain location:  L chest and R chest Pain quality: aching   Pain radiates to:  Upper back Pain severity:  Moderate Onset quality:  Gradual Timing:  Constant Progression:  Worsening Chronicity:  New Context: not movement   Relieved by:  Nothing Worsened by:  Nothing Ineffective treatments:  None tried Associated symptoms: dizziness   Associated symptoms: no abdominal pain and no shortness of breath   Risk factors: male sex   Risk factors: no diabetes mellitus, no high cholesterol and no hypertension     Past Medical History:  Diagnosis Date  . ADHD (attention deficit hyperactivity disorder)   . Allergy    SEASONAL  . Anxiety states   . Esophageal reflux   . Esophagitis   . Hiatal hernia   . Hypertension   . Hypogonadism male   . Low back pain    History reviewed. No pertinent surgical history. Family History  Problem Relation Age of Onset  . Cancer Mother     ovarian  . Heart disease Father   . Colon cancer Neg Hx    Social History  Substance Use Topics  . Smoking status: Never Smoker  . Smokeless tobacco: Never Used  . Alcohol use No    Review of Systems  Respiratory: Negative for shortness of breath.   Cardiovascular: Positive for chest pain.   Gastrointestinal: Negative for abdominal pain.  Neurological: Positive for dizziness.  All other systems reviewed and are negative.   Allergies  Erythromycin  Home Medications   Prior to Admission medications   Medication Sig Start Date End Date Taking? Authorizing Provider  amphetamine-dextroamphetamine (ADDERALL XR) 30 MG 24 hr capsule Take 1 capsule (30 mg total) by mouth 2 (two) times daily. 06/13/16   Laurey Morale, MD   Meds Ordered and Administered this Visit  Medications - No data to display  BP 127/82 (BP Location: Left Arm)   Pulse 82   Temp 98.5 F (36.9 C) (Oral)   Resp 16   Ht 5\' 6"  (1.676 m)   Wt 178 lb (80.7 kg)   SpO2 97%   BMI 28.73 kg/m  No data found.   Physical Exam  Constitutional: He appears well-developed and well-nourished.  HENT:  Head: Normocephalic and atraumatic.  Eyes: Conjunctivae are normal.  Neck: Neck supple.  Cardiovascular: Normal rate, regular rhythm and normal heart sounds.   No murmur heard. Pulmonary/Chest: Effort normal and breath sounds normal. No respiratory distress.  Abdominal: Soft. There is no tenderness.  Musculoskeletal: He exhibits no edema.  Neurological: He is alert.  Skin: Skin is warm and dry.  Psychiatric: He has a normal mood and affect.  Nursing note and vitals reviewed.   Urgent Care Course  Clinical Course    Procedures (including critical care time)  Labs Review Labs Reviewed - No data to display  Imaging Review No results found.   Visual Acuity Review  Right Eye Distance:   Left Eye Distance:   Bilateral Distance:    Right Eye Near:   Left Eye Near:    Bilateral Near:      EKG normal sinus, normal st,  No acute  Pt has a heart score of 0 (without including troponin)  I think pt is low risk and pain may be muscular.  MDM     1. Other chest pain    Pt advised I feel he needs more definitive testing.   Pt agrees to go to [facility] [place] for troponin and further evaluation An  After Visit Summary was printed and given to the patient.   [place], PA-C 06/13/16 1022

## 2016-06-14 MED FILL — DEXTROAMP-AMPHET ER 30 MG C: 30 | 30 days supply | Qty: 60 | Fill #0

## 2016-07-18 MED FILL — DEXTROAMP-AMPHET ER 30 MG C: 30 | 30 days supply | Qty: 60 | Fill #0

## 2016-08-17 ENCOUNTER — Ambulatory Visit: Payer: 59 | Admitting: Adult Health

## 2016-08-17 DIAGNOSIS — J069 Acute upper respiratory infection, unspecified: Secondary | ICD-10-CM | POA: Diagnosis not present

## 2016-08-17 MED FILL — AMOX-CLAV 875-125 MG TABLET: 875-125 | 10 days supply | Qty: 20 | Fill #0

## 2016-08-17 MED FILL — DEXTROAMP-AMPHET ER 30 MG C: 30 | 30 days supply | Qty: 60 | Fill #0

## 2016-10-24 ENCOUNTER — Telehealth: Payer: Self-pay | Admitting: Family Medicine

## 2016-10-24 MED ORDER — AMPHETAMINE-DEXTROAMPHET ER 30 MG PO CP24: 30.0000 mg | ORAL_CAPSULE | Freq: Two times a day (BID) | ORAL | 0 refills | Status: DC

## 2016-10-24 NOTE — Telephone Encounter (Signed)
Script is ready for pick up here at front office and left a message.  

## 2016-10-24 NOTE — Telephone Encounter (Signed)
Pt need new rx generic adderall xr 30 mg °

## 2016-10-24 NOTE — Telephone Encounter (Signed)
done

## 2016-10-25 MED FILL — DEXTROAMP-AMPHET ER 30 MG C: 30 | 30 days supply | Qty: 60 | Fill #0

## 2016-11-13 ENCOUNTER — Ambulatory Visit (INDEPENDENT_AMBULATORY_CARE_PROVIDER_SITE_OTHER): Payer: Self-pay

## 2016-11-13 ENCOUNTER — Encounter (INDEPENDENT_AMBULATORY_CARE_PROVIDER_SITE_OTHER): Payer: Self-pay | Admitting: Physical Medicine and Rehabilitation

## 2016-11-13 ENCOUNTER — Ambulatory Visit (INDEPENDENT_AMBULATORY_CARE_PROVIDER_SITE_OTHER): Payer: 59 | Admitting: Physical Medicine and Rehabilitation

## 2016-11-13 ENCOUNTER — Telehealth (INDEPENDENT_AMBULATORY_CARE_PROVIDER_SITE_OTHER): Payer: Self-pay | Admitting: Physical Medicine and Rehabilitation

## 2016-11-13 VITALS — BP 168/106 | HR 87

## 2016-11-13 DIAGNOSIS — M25512 Pain in left shoulder: Secondary | ICD-10-CM | POA: Diagnosis not present

## 2016-11-13 DIAGNOSIS — M25562 Pain in left knee: Secondary | ICD-10-CM

## 2016-11-13 DIAGNOSIS — G8929 Other chronic pain: Secondary | ICD-10-CM | POA: Diagnosis not present

## 2016-11-13 DIAGNOSIS — M5412 Radiculopathy, cervical region: Secondary | ICD-10-CM | POA: Diagnosis not present

## 2016-11-13 DIAGNOSIS — M545 Low back pain, unspecified: Secondary | ICD-10-CM

## 2016-11-13 DIAGNOSIS — M609 Myositis, unspecified: Secondary | ICD-10-CM | POA: Diagnosis not present

## 2016-11-13 DIAGNOSIS — M25561 Pain in right knee: Secondary | ICD-10-CM | POA: Diagnosis not present

## 2016-11-13 MED ORDER — BACLOFEN 10 MG PO TABS: 10.0000 mg | ORAL_TABLET | Freq: Three times a day (TID) | ORAL | 0 refills | Status: DC | PRN

## 2016-11-13 MED FILL — BACLOFEN 10 MG TABLET: 10 | 20 days supply | Qty: 60 | Fill #0

## 2016-11-13 NOTE — Progress Notes (Signed)
Patrick Brock - 41 y.o. male MRN [id number]  Date of birth: January 29, 1976  Office Visit Note: Visit Date: 11/13/2016 PCP: Alysia Penna, MD Referred by: Laurey Morale, MD  Subjective: Chief Complaint  Patient presents with  . Neck - Pain  . Left Shoulder - Pain  . Right Knee - Pain  . Left Knee - Pain   HPI: Patrick Brock is a very pleasant 41 year old gentleman who is actually a patient of Dr. Otho Ket. We have not seen him in the office in 3 years. He comes in today with progressive worsening neck and shoulder pain- primarily left shoulder. At times he feels like the pain goes up back of head. He does report a referral pattern down past the elbow. He gets some tingling around the ulnar nerve at the elbow. He also gets some paresthesia or dysesthesia in the fourth and fifth digit on the left.  When we saw him 3 years ago we had completed 2 epidural injections was gave him quite a bit relief for up to 6 months at a time. At that time Dr. Louanne Skye did not deem him candidate. He did have a disc herniation and mild central stenosis particularly at C6-7 and C5-6. That MRI is reviewed below from 2012. He has not had any recent images and we did get x-rays of the cervical spine today. He does not report a specific injury although he was doing some overhead work with a picture and hanging something on the wall ice as ever since that time he feels like it has worsens. He denies any specific associated headache or blurry vision. He's had no specific focal weakness. In terms of other complaints he also complains of sharp pain left side of lower back. He feels like this lower back pain is worse when the neck and shoulders worse. He talks about it being worse when he reaches or moves. In the past it had physical therapy and he had medication management. Alternates ice and heat. He has used some ibuprofen without relief. He has not had any recent chiropractic care.  A third complaint today is bilateral knee pain. His  peripatellar and just below the joint line. Worse with ambulating standing and moving. He does have to move a lot at work and is worsened then. He has not really sought any care for that but just wanted to bring it up. The knee pain is clearly not something that is bad enough to look at compared to his neck and she states today. He's had no swelling. He's had no twisting injuries. He's had no prior surgeries or orthopedic work done to the knees. He has not had specific therapy for his knees. Right is equal to left. He has had no popping or locking.       Review of Systems  Constitutional: Negative for chills, fever, malaise/fatigue and weight loss.  HENT: Negative for hearing loss and sinus pain.   Eyes: Negative for blurred vision, double vision and photophobia.  Respiratory: Negative for cough and shortness of breath.   Cardiovascular: Negative for chest pain, palpitations and leg swelling.  Gastrointestinal: Negative for abdominal pain, nausea and vomiting.  Genitourinary: Negative for flank pain.  Musculoskeletal: Positive for back pain, joint pain and neck pain. Negative for myalgias.  Skin: Negative for itching and rash.  Neurological: Negative for tremors, focal weakness and weakness.  Endo/Heme/Allergies: Negative.   Psychiatric/Behavioral: Negative for depression.  All other systems reviewed and are negative.  Otherwise per HPI.  Assessment & Plan: Visit Diagnoses:  1. Cervical radiculopathy   2. Chronic left shoulder pain   3. Myofascitis   4. Chronic left-sided low back pain without sciatica   5. Chronic pain of left knee   6. Chronic pain of right knee     Plan: Findings:  Chronic history of cervical pain and pathology with disc herniation and radicular pain several years ago that was relieved to a great degree with epidural injection. I think he also has myofascial pain syndrome with a lot of stiffness and tightness in the paraspinal region both cervical and lumbar. He  has positive trigger points to do reproduce some of his pain. Sometimes this is a chicken or a situation where the nerve roots are irritated in the trigger points are worse. He is anxious appearing and does carry a lot of tension. His blood pressure was higher today in the office the normal. I think he would do well with repeat injection at this point as there is no red flag symptoms and it did help him quite a bit. Having not had that before I probably would not suggest at this early in the phase of his symptoms as I do think he has a lot of myofascial pain. Depending on the relief with the injection I would probably get him in to see a physical therapist for mechanical and manual work along with dry needling. I've also given him a prescription for baclofen as a muscle relaxer for pain relief and to try to decrease some of the tone that he has. He would probably benefit from pain psychotherapy or even mindfulness training that we did not discuss this today. From a standpoint of his knees I did evaluate his knees today and did not see anything really wrong with him. 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 Depending on where that goes I probably have him follow up with Dr. Louanne Skye for his neck and knee at that point. I do not feel it is situation is a situation where her opioid medication shouldn't play a role at this point. I spent more than 45 minutes speaking face-to-face with the patient with 50% of the time in counseling.    Meds & Orders:  Meds ordered this encounter  Medications  . baclofen (LIORESAL) 10 MG tablet    Sig: Take 1 tablet (10 mg total) by mouth every 8 (eight) hours as needed for muscle spasms (Pain).    Dispense:  60 tablet    Refill:  0    Orders Placed This Encounter  Procedures  . XR Cervical Spine 2 or 3 views  . XR Shoulder Left    Follow-up: Return for Schedule C7-T1 interlaminar epidural steroid injection. Would like to  have him follow up with Dr. Louanne Skye depending on the results of the cervical injection in his knee pain.  Procedures: No procedures performed  No notes on file   Clinical History: Cspine MRI 08/27/2011 IMPRESSION:  1.  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  There is no cord deformity or foraminal compromise. 2.  Small left paracentral disc protrusion at C4-C5 and central disc protrusion at C5-C6 without cord deformity or foraminal compromise. 3.  No acute osseous or paraspinal findings demonstrated.  He reports that he has never smoked. He has never used smokeless tobacco. No results for input(s): HGBA1C, LABURIC in the last 8760 hours.  Objective:  VS:  HT:  WT:   BMI:     BP:(!) 168/106  HR:87bpm  TEMP: ( )  RESP:  Physical Exam  Constitutional: He is oriented to person, place, and time. He appears well-developed and well-nourished. No distress.  Anxious appearing  HENT:  Head: Normocephalic and atraumatic.  Nose: Nose normal.  Mouth/Throat: Oropharynx is clear and moist.  Eyes: Conjunctivae are normal. Pupils are equal, round, and reactive to light.  Neck: Neck supple. No tracheal deviation present.  Cervical range of motion is limited with rotation extension and forward flexion. He pretty much is caring a very stiff neck.  Cardiovascular: Normal rate, regular rhythm and intact distal pulses.   Blood pressure measurement was high spent she with a diastolic number. He states that he usually runs under that. He does check this at home and we'll check this on his own. He'll follow up with his primary care doctor if this were to stay elevated.  Pulmonary/Chest: Effort normal and breath sounds normal.  Abdominal: Soft. He exhibits no distension.  Musculoskeletal: He exhibits no deformity.  The patient's cervical range of motion is limited. He has pain with rotation and forward flexion. He has trigger points in the levator scapula  and rhomboids that are pretty significant and do reproduce a lot of his pain. He has pain with shoulder rotation but is not really impingements it's just more muscular pain throughout the full range of motion. He has no pain over the biceps tendon. He has good distal strength in both hands without any deficits. He does have a positive Tinel's over the elbow on the left. He has intact sensation.  Examination of the lower back shows some paraspinal tenderness and tightness along the paraspinal region. He has good distal strength in the lower extremities without clonus bilaterally. He should've the knees shows no joint line tenderness or swelling. He has good stability.  Neurological: He is alert and oriented to person, place, and time. He displays normal reflexes. No cranial nerve deficit. He exhibits normal muscle tone. Coordination normal.  He does have a positive Tinel's over the left ulnar nerve at the elbow. He has a negative Hoffmann's test bilaterally.  Skin: Skin is warm. No rash noted.  Psychiatric: He has a normal mood and affect. His behavior is normal.  Nursing note and vitals reviewed.   Ortho Exam Imaging: No results found.  Past Medical/Family/Surgical/Social History: Medications & Allergies reviewed per EMR Patient Active Problem List   Diagnosis Date Noted  . 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 GERD (gastroesophageal reflux disease) 06/05/2011  . Musculoskeletal pain 04/12/2011  . VIRAL URI 09/13/2010  . COSTOCHONDRITIS 09/13/2010  . HYPOGONADISM 07/14/2010  . GLOSSITIS 07/14/2010  . MYALGIA 05/15/2010  . WEAKNESS 05/15/2010  . ADHD 06/28/2009  . HEMATURIA UNSPECIFIED 06/28/2009  . ANXIETY 08/08/2007  . LOW BACK PAIN 08/08/2007   Past Medical History:  Diagnosis Date  . ADHD (attention deficit hyperactivity disorder)   . Allergy    SEASONAL  . Anxiety states   . Esophageal reflux   . Esophagitis   . Hiatal hernia   . Hypertension   . Hypogonadism male   .  Low back pain    Family History  Problem Relation Age of Onset  . Cancer Mother     ovarian  . Heart disease Father   . Colon cancer Neg Hx    History reviewed. No pertinent surgical history. Social History   Occupational History  . 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 Smoking status: Never Smoker  . Smokeless tobacco: Never Used  . Alcohol use No  . Drug use: No  . Sexual activity: Not on file

## 2016-11-14 ENCOUNTER — Encounter (INDEPENDENT_AMBULATORY_CARE_PROVIDER_SITE_OTHER): Payer: Self-pay | Admitting: Physical Medicine and Rehabilitation

## 2016-11-16 ENCOUNTER — Encounter (INDEPENDENT_AMBULATORY_CARE_PROVIDER_SITE_OTHER): Payer: Self-pay | Admitting: Physical Medicine and Rehabilitation

## 2016-11-16 ENCOUNTER — Ambulatory Visit (INDEPENDENT_AMBULATORY_CARE_PROVIDER_SITE_OTHER): Payer: 59 | Admitting: Physical Medicine and Rehabilitation

## 2016-11-16 VITALS — BP 152/104 | HR 88 | Temp 98.3°F

## 2016-11-16 DIAGNOSIS — M5412 Radiculopathy, cervical region: Secondary | ICD-10-CM

## 2016-11-16 MED ORDER — TRAMADOL HCL 50 MG PO TABS: 50.0000 mg | ORAL_TABLET | Freq: Three times a day (TID) | ORAL | 0 refills | Status: DC | PRN

## 2016-11-16 MED ORDER — LIDOCAINE HCL (PF) 1 % IJ SOLN
0.3300 mL | Freq: Once | INTRAMUSCULAR | Status: AC
  Administered 2016-11-16: 0.3 mL

## 2016-11-16 MED ORDER — METHYLPREDNISOLONE ACETATE 80 MG/ML IJ SUSP
80.0000 mg | Freq: Once | INTRAMUSCULAR | Status: AC
  Administered 2016-11-16: 80 mg

## 2016-11-16 MED FILL — traMADol HCL 50 MG TABS: 50 | 13 days supply | Qty: 40 | Fill #0

## 2016-11-16 NOTE — Procedures (Signed)
Cervical Epidural Steroid Injection - Interlaminar Approach with Fluoroscopic Guidance  Patient: Patrick Brock      Date of Birth: December 19, 1975 MRN: [id number] PCP: Alysia Penna, MD      Visit Date: 11/16/2016   Universal Protocol:    Date/Time: 01/12/188:22 AM  Consent Given By: the patient  Position: PRONE  Additional Comments: Vital signs were monitored before and after the procedure. Patient was prepped and draped in the usual sterile fashion. The correct patient, procedure, and site was verified.   Injection Procedure Details:  Procedure Site One Meds Administered:  Meds ordered this encounter  Medications  . lidocaine (PF) (XYLOCAINE) 1 % injection 0.3 mL  . methylPREDNISolone acetate (DEPO-MEDROL) injection 80 mg     Laterality: Left  Location/Site:  C7-T1  Needle size: 20 G  Needle type: Touhy  Needle Placement: Paramedian epidural space  Findings:  -Contrast Used: 1 mL iohexol 180 mg iodine/mL   -Comments: Excellent flow of contrast into the epidural space.  Procedure Details: Using a paramedian approach from the side mentioned above, the region overlying the inferior lamina was localized under fluoroscopic visualization and the soft tissues overlying this structure were infiltrated with 4 ml. of 1% Lidocaine without Epinephrine. A # 20 gauge, Tuohy needle was inserted into the epidural space using a paramedian approach.  The epidural space was localized using loss of resistance along with lateral and contralateral oblique bi-planar fluoroscopic views.  After negative aspirate for air, blood, and CSF, a 2 ml. volume of Isovue-250 was injected into the epidural space and the flow of contrast was observed. Radiographs were obtained for documentation purposes.   The injectate was administered into the level noted above.  Additional Comments:  The patient tolerated the procedure well No complications occurred Dressing: Band-Aid    Post-procedure  details: Patient was observed during the procedure. Post-procedure instructions were reviewed.  Patient left the clinic in stable condition.

## 2016-11-16 NOTE — Progress Notes (Signed)
Patrick Brock - 41 y.o. male MRN [id number]  Date of birth: May 23, 1976  Office Visit Note: Visit Date: 11/16/2016 PCP: Alysia Penna, MD Referred by: Laurey Morale, MD  Subjective: Chief Complaint  Patient presents with  . Neck - Pain   HPI: Patrick Brock is a 41 year old gentleman that we seen in the past for cervical epidural injection with good relief a couple years ago. He had a couple of disc herniations that were clearly irritating the cervical nerve roots without compression. We have seen him recently for evaluation and management. We are going to complete the injection today. He is asking for pain medication I did prescribe tramadol to take with Tylenol. I do think a lot of this is myofascial pain as well and he does have a history of off and on use of hydrocodone we did check the registry is not using any recently.    No change. Can't tell any difference with the Baclofen. 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  ROS Otherwise per HPI.  Assessment & Plan: Visit Diagnoses:  1. Cervical radiculopathy     Plan: Findings:  Plan is a left C7-T1 intralaminar epidural steroid injection. If he doesn't get much relief with this we would look at physical therapy. After that would be reimaging the cervical spine and possibly surgical referral depending on the situation. This would not be a chronic opioid management situation.    Meds & Orders:  Meds ordered this encounter  Medications  . lidocaine (PF) (XYLOCAINE) 1 % injection 0.3 mL  . methylPREDNISolone acetate (DEPO-MEDROL) injection 80 mg  . traMADol (ULTRAM) 50 MG tablet    Sig: Take 1 tablet (50 mg total) by mouth every 8 (eight) hours as needed for moderate pain.    Dispense:  40 tablet    Refill:  0    Orders Placed This Encounter  Procedures  . Epidural Steroid injection    Follow-up: Return if symptoms worsen or fail to improve, 2 weeks.   Procedures: No  procedures performed  Cervical Epidural Steroid Injection - Interlaminar Approach with Fluoroscopic Guidance  Patient: Patrick Brock      Date of Birth: [birth year] MRN: [id number] PCP: Alysia Penna, MD      Visit Date: 11/16/2016   Universal Protocol:    Date/Time: 01/12/188:22 AM  Consent Given By: the patient  Position: PRONE  Additional Comments: Vital signs were monitored before and after the procedure. Patient was prepped and draped in the usual sterile fashion. The correct patient, procedure, and site was verified.   Injection Procedure Details:  Procedure Site One Meds Administered:  Meds ordered this encounter  Medications  . lidocaine (PF) (XYLOCAINE) 1 % injection 0.3 mL  . methylPREDNISolone acetate (DEPO-MEDROL) injection 80 mg     Laterality: Left  Location/Site:  C7-T1  Needle size: 20 G  Needle type: Touhy  Needle Placement: Paramedian epidural space  Findings:  -Contrast Used: 1 mL iohexol 180 mg iodine/mL   -Comments: Excellent flow of contrast into the epidural space.  Procedure Details: Using a paramedian approach from the side mentioned above, the region overlying the inferior lamina was localized under fluoroscopic visualization and the soft tissues overlying this structure were infiltrated with 4 ml. of 1% Lidocaine without Epinephrine. A # 20 gauge, Tuohy needle was inserted into the epidural space using a paramedian approach.  The epidural space was localized using loss of resistance along with  lateral and contralateral oblique bi-planar fluoroscopic views.  After negative aspirate for air, blood, and CSF, a 2 ml. volume of Isovue-250 was injected into the epidural space and the flow of contrast was observed. Radiographs were obtained for documentation purposes.   The injectate was administered into the level noted above.  Additional Comments:  The patient tolerated the procedure well No complications occurred Dressing: Band-Aid     Post-procedure details: Patient was observed during the procedure. Post-procedure instructions were reviewed.  Patient left the clinic in stable condition.        Clinical History: Cspine MRI 08/27/2011 IMPRESSION:  1.  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  There is no cord deformity or foraminal compromise. 2.  Small left paracentral disc protrusion at C4-C5 and central disc protrusion at C5-C6 without cord deformity or foraminal compromise. 3.  No acute osseous or paraspinal findings demonstrated.  He reports that he has never smoked. He has never used smokeless tobacco. No results for input(s): HGBA1C, LABURIC in the last 8760 hours.  Objective:  VS:  HT:    WT:   BMI:     BP:(!) 152/104  HR:88bpm  TEMP:98.3 F (36.8 C)(Oral)  RESP:97 % Physical Exam  Constitutional: He is oriented to person, place, and time. He appears well-developed and well-nourished.  Anxious appearing  Musculoskeletal:  Cervical range of motion is limited particularly with left rotation compared to right. His pain with extension he has trigger points in the levator scapula and rhomboids. He has no impingement signs. He has good distal strength in the upper extremities.  Neurological: He is alert and oriented to person, place, and time. He displays normal reflexes. No cranial nerve deficit. He exhibits normal muscle tone. Coordination normal.  Nursing note and vitals reviewed.   Ortho Exam Imaging: No results found.  Past Medical/Family/Surgical/Social History: Medications & Allergies reviewed per EMR Patient Active Problem List   Diagnosis Date Noted  . 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 GERD (gastroesophageal reflux disease) 06/05/2011  . Musculoskeletal pain 04/12/2011  . VIRAL URI 09/13/2010  . COSTOCHONDRITIS 09/13/2010  . HYPOGONADISM 07/14/2010  . GLOSSITIS 07/14/2010  . MYALGIA 05/15/2010  . WEAKNESS 05/15/2010  . ADHD  06/28/2009  . HEMATURIA UNSPECIFIED 06/28/2009  . ANXIETY 08/08/2007  . LOW BACK PAIN 08/08/2007   Past Medical History:  Diagnosis Date  . ADHD (attention deficit hyperactivity disorder)   . Allergy    SEASONAL  . Anxiety states   . Esophageal reflux   . Esophagitis   . Hiatal hernia   . Hypertension   . Hypogonadism male   . Low back pain    Family History  Problem Relation Age of Onset  . Cancer Mother     ovarian  . Heart disease Father   . Colon cancer Neg Hx    History reviewed. No pertinent surgical history. 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 Smoking status: Never Smoker  . Smokeless tobacco: Never Used  . Alcohol use No  . Drug use: No  . Sexual activity: Not on file

## 2016-11-16 NOTE — Patient Instructions (Addendum)
Please take 1 Extra-strength tylenol with tramadol and can continue to use Ibuprofen.   [employer] Discharge Instructions  *At any time if you have questions or concerns they can be answered by calling [phone]  All Patients: . You may experience an increase in your symptoms for the first 2 days (it can take 2 days to 2 weeks for the steroid/cortisone to have its maximal effect). . You may use ice to the site for the first 24 hours; 20 minutes on and 20 minutes off and may use heat after that time. . You may resume and continue your current pain medications. If you need a refill please contact the prescribing physician. . You may resume her regular medications if any were stopped for the procedure. . You may shower but no swimming, tub bath or Jacuzzi for 24 hours. . Please remove bandage after 4 hours. . You may resume light activities as tolerated. . If you had Spine Injection, you should not drive for the next 3 hours due to anesthetics used in the procedure. Please have someone drive for you.  *If you have had sedation, Valium, Xanax, or lorazepam: Do not drive or use public transportation for 24 hours, do not operating hazardous machinery or make important personal/business decisions for 24 hours.  POSSIBLE STEROID SIDE EFFECTS: If experienced these should only last for a short period. Change in menstrual flow  Edema in (swelling)  Increased appetite Skin flushing (redness)  Skin rash/acne  Thrush (oral) Vaginitis    Increased sweating  Depression Increased blood glucose levels Cramping and leg/calf  Euphoria (feeling happy)  POSSIBLE PROCEDURE SIDE EFFECTS: Please call our office if concerned. Increased pain Increased numbness/tingling  Headache Nausea/vomiting Hematoma (bruising/bleeding) Edema (swelling at the site) Weakness  Infection (red/drainage at site) Fever greater than 100.35F  *In the event of a headache after epidural steroid injection: Drink  plenty of fluids, especially water and try to lay flat when possible. If the headache does not get better after a few days or as always if concerned please call the office.

## 2016-11-16 NOTE — Telephone Encounter (Signed)
Patient scheduled and seen.

## 2016-11-28 ENCOUNTER — Telehealth (INDEPENDENT_AMBULATORY_CARE_PROVIDER_SITE_OTHER): Payer: Self-pay

## 2016-11-28 DIAGNOSIS — M542 Cervicalgia: Secondary | ICD-10-CM

## 2016-11-28 DIAGNOSIS — M7918 Myalgia, other site: Secondary | ICD-10-CM

## 2016-11-28 NOTE — Telephone Encounter (Signed)
Referral sent to [facility]

## 2016-11-28 NOTE — Telephone Encounter (Signed)
Wife left voice mail requesting a rx for PT be sent to [facility] in [place]. Pt is already scheduled for 12/06/16.

## 2016-11-28 NOTE — Addendum Note (Signed)
Addended by: Raymondo Band on: 11/28/2016 11:31 AM   Modules accepted: Orders

## 2016-12-06 ENCOUNTER — Ambulatory Visit: Payer: 59 | Admitting: Rehabilitative and Restorative Service Providers"

## 2016-12-20 MED FILL — ADDERALL XR 30 MG CAP SA: 30 | 30 days supply | Qty: 60 | Fill #0

## 2017-01-01 ENCOUNTER — Encounter: Payer: Self-pay | Admitting: Family Medicine

## 2017-01-01 ENCOUNTER — Ambulatory Visit (INDEPENDENT_AMBULATORY_CARE_PROVIDER_SITE_OTHER): Payer: 59 | Admitting: Family Medicine

## 2017-01-01 VITALS — BP 121/87 | HR 108 | Temp 98.8°F | Ht 66.0 in | Wt 192.0 lb

## 2017-01-01 DIAGNOSIS — N390 Urinary tract infection, site not specified: Secondary | ICD-10-CM

## 2017-01-01 DIAGNOSIS — R319 Hematuria, unspecified: Secondary | ICD-10-CM

## 2017-01-01 DIAGNOSIS — K602 Anal fissure, unspecified: Secondary | ICD-10-CM

## 2017-01-01 LAB — POC URINALSYSI DIPSTICK (AUTOMATED)
Bilirubin, UA: NEGATIVE
Glucose, UA: NEGATIVE
Ketones, UA: NEGATIVE
LEUKOCYTES UA: NEGATIVE
NITRITE UA: NEGATIVE
PROTEIN UA: NEGATIVE
Spec Grav, UA: >=1.03

## 2017-01-01 MED ORDER — CIPROFLOXACIN HCL 500 MG PO TABS: 500.0000 mg | ORAL_TABLET | Freq: Two times a day (BID) | ORAL | 0 refills | Status: DC

## 2017-01-01 MED FILL — CIPROFLOXACIN HCL 500 MG TA: 500 | 10 days supply | Qty: 20 | Fill #0

## 2017-01-01 NOTE — Progress Notes (Signed)
   Subjective:    Patient ID: Patrick Brock, male    DOB: 1976-04-25, 41 y.o.   MRN: [id number]  HPI Here for several symptoms that started about 3 weeks ago. He first noticed some urgency to urinate and some strong odor to it. He had some dribbling at the end of the stream as well. No fever or nausea. No burning. No abdominal pain. Then yesterday he developed some aching pains in the middle back just below the right shoulder blade. Also for the past few days he has passed some hard stools, he has had some anal pain, and he has had BRB with the stools.    Review of Systems  Constitutional: Negative.   Respiratory: Negative.   Cardiovascular: Negative.   Gastrointestinal: Positive for anal bleeding and constipation. Negative for abdominal distention, abdominal pain, blood in stool, diarrhea, nausea, rectal pain and vomiting.  Genitourinary: Positive for frequency and urgency. Negative for dysuria, flank pain and hematuria.       Objective:   Physical Exam  Constitutional: He appears well-developed and well-nourished. No distress.  Cardiovascular: Normal rate, regular rhythm, normal heart sounds and intact distal pulses.   Pulmonary/Chest: Effort normal and breath sounds normal.  Abdominal: Soft. Bowel sounds are normal. He exhibits no distension and no mass. There is no rebound and no guarding.  Mildly tender in the right flank and in the left flank   Genitourinary:  Genitourinary Comments: The anus has a small partially closed fissure on the verge. No external hemorrhoids. No masses or tenderness in the rectum. The prostate is normal on exam.           Assessment & Plan:  He appears to have a mild UTI along with some coincidental constipation causing an anal fissure. I advised him to drink plenty of water and to get more fiber in the diet. Treat the UTI with Cipro for 10 days. Culture the sample.  Alysia Penna, MD

## 2017-01-01 NOTE — Progress Notes (Signed)
Pre visit review using our clinic review tool, if applicable. No additional management support is needed unless otherwise documented below in the visit note. 

## 2017-01-02 LAB — URINE CULTURE: Organism ID, Bacteria: NO GROWTH

## 2017-02-07 MED FILL — ADDERALL XR 30 MG CAP SA: 30 | 30 days supply | Qty: 60 | Fill #0

## 2017-03-08 ENCOUNTER — Ambulatory Visit (INDEPENDENT_AMBULATORY_CARE_PROVIDER_SITE_OTHER): Payer: 59 | Admitting: Family Medicine

## 2017-03-08 ENCOUNTER — Encounter: Payer: Self-pay | Admitting: Family Medicine

## 2017-03-08 VITALS — BP 128/90 | HR 92 | Temp 98.4°F | Wt 195.6 lb

## 2017-03-08 DIAGNOSIS — J019 Acute sinusitis, unspecified: Secondary | ICD-10-CM | POA: Diagnosis not present

## 2017-03-08 MED ORDER — AMPHETAMINE-DEXTROAMPHET ER 30 MG PO CP24: 30.0000 mg | ORAL_CAPSULE | Freq: Two times a day (BID) | ORAL | 0 refills | Status: DC

## 2017-03-08 MED ORDER — AZITHROMYCIN 250 MG PO TABS
ORAL_TABLET | ORAL | 0 refills | Status: DC
Start: 2017-03-08 — End: 2017-05-04

## 2017-03-08 MED ORDER — METHYLPREDNISOLONE ACETATE 80 MG/ML IJ SUSP
120.0000 mg | Freq: Once | INTRAMUSCULAR | Status: AC
  Administered 2017-03-08: 120 mg via INTRAMUSCULAR

## 2017-03-08 MED FILL — ADDERALL XR 30 MG CAP SA: 30 | 30 days supply | Qty: 60 | Fill #0

## 2017-03-08 MED FILL — AZITHROMYCIN 250 MG TAB: 250 | 5 days supply | Qty: 6 | Fill #0

## 2017-03-08 NOTE — Addendum Note (Signed)
Addended by: Wyvonne Lenz on: 03/08/2017 02:04 PM   Modules accepted: Orders

## 2017-03-08 NOTE — Progress Notes (Signed)
Pre visit review using our clinic review tool, if applicable. No additional management support is needed unless otherwise documented below in the visit note. 

## 2017-03-08 NOTE — Progress Notes (Signed)
   Subjective:    Patient ID: Patrick Brock, male    DOB: Dec 29, 1975, 41 y.o.   MRN: [id number]  HPI Here for 10 days of sinus pressure, bilateral ear pain, PND, and a dry cough. He has been dizzy at times and this makes him nauseated. No fever. On Alka-Seltzer.    Review of Systems  Constitutional: Negative.   HENT: Positive for congestion, ear pain, postnasal drip, sinus pain and sinus pressure. Negative for sore throat.   Eyes: Negative.   Respiratory: Positive for cough.   Neurological: Positive for dizziness and headaches.       Objective:   Physical Exam  Constitutional: He appears well-developed and well-nourished. No distress.  HENT:  Right Ear: External ear normal.  Left Ear: External ear normal.  Nose: Nose normal.  Mouth/Throat: Oropharynx is clear and moist.  Eyes: Conjunctivae are normal.  Neck: No thyromegaly present.  Pulmonary/Chest: Effort normal and breath sounds normal. No respiratory distress. He has no wheezes. He has no rales. He exhibits no tenderness.  Lymphadenopathy:    He has no cervical adenopathy.          Assessment & Plan:  Sinusitis, causing some vertigo. Treat with a Zpack and Mucinex D bid. Given a steroid shot.  Alysia Penna, MD

## 2017-03-08 NOTE — Patient Instructions (Signed)
WE NOW OFFER   [facility] FAST TRACK!!!  SAME DAY Appointments for ACUTE CARE  Such as: Sprains, Injuries, cuts, abrasions, rashes, muscle pain, joint pain, back pain Colds, flu, sore throats, headache, allergies, cough, fever  Ear pain, sinus and eye infections Abdominal pain, nausea, vomiting, diarrhea, upset stomach Animal/insect bites  3 Easy Ways to Schedule: Walk-In Scheduling Call in scheduling Mychart Sign-up: [email or web address]         

## 2017-03-31 DIAGNOSIS — H5213 Myopia, bilateral: Secondary | ICD-10-CM | POA: Diagnosis not present

## 2017-05-02 MED FILL — ADDERALL XR 30 MG CAP SA: 30 | 30 days supply | Qty: 60 | Fill #0

## 2017-05-04 ENCOUNTER — Encounter: Payer: Self-pay | Admitting: Family Medicine

## 2017-05-04 ENCOUNTER — Ambulatory Visit (INDEPENDENT_AMBULATORY_CARE_PROVIDER_SITE_OTHER): Payer: 59 | Admitting: Family Medicine

## 2017-05-04 VITALS — BP 128/86 | HR 97 | Temp 98.1°F | Wt 188.5 lb

## 2017-05-04 DIAGNOSIS — R1032 Left lower quadrant pain: Secondary | ICD-10-CM | POA: Diagnosis not present

## 2017-05-04 DIAGNOSIS — N5082 Scrotal pain: Secondary | ICD-10-CM | POA: Insufficient documentation

## 2017-05-04 LAB — POC URINALSYSI DIPSTICK (AUTOMATED)
Bilirubin, UA: NEGATIVE
Blood, UA: NEGATIVE
GLUCOSE UA: NEGATIVE
KETONES UA: NEGATIVE
LEUKOCYTES UA: NEGATIVE
Nitrite, UA: NEGATIVE
Protein, UA: NEGATIVE
SPEC GRAV UA: 1.02 (ref 1.010–1.025)
UROBILINOGEN UA: 0.2 U/dL
pH, UA: 6 (ref 5.0–8.0)

## 2017-05-04 MED ORDER — NAPROXEN 500 MG PO TABS: 500.0000 mg | ORAL_TABLET | Freq: Two times a day (BID) | ORAL | 0 refills | Status: DC

## 2017-05-04 NOTE — Patient Instructions (Signed)
Inguinal Hernia, Adult An inguinal hernia is when fat or the intestines push through the area where the leg meets the lower belly (groin) and make a rounded lump (bulge). This condition happens over time. There are three types of inguinal hernias. These types include:  Hernias that can be pushed back into the belly (are reducible).  Hernias that cannot be pushed back into the belly (are incarcerated).  Hernias that cannot be pushed back into the belly and lose their blood supply (get strangulated). This type needs emergency surgery.  Follow these instructions at home: Lifestyle  Drink enough fluid to keep your urine (pee) clear or pale yellow.  Eat plenty of fruits, vegetables, and whole grains. These have a lot of fiber. Talk with your doctor if you have questions.  Avoid lifting heavy objects.  Avoid standing for long periods of time.  Do not use tobacco products. These include cigarettes, chewing tobacco, or e-cigarettes. If you need help quitting, ask your doctor.  Try to stay at a healthy weight. General instructions  Do not try to force the hernia back in.  Watch your hernia for any changes in color or size. Let your doctor know if there are any changes.  Take over-the-counter and prescription medicines only as told by your doctor.  Keep all follow-up visits as told by your doctor. This is important. Contact a doctor if:  You have a fever.  You have new symptoms.  Your symptoms get worse. Get help right away if:  The area where the legs meets the lower belly has: ? Pain that gets worse suddenly. ? A bulge that gets bigger suddenly and does not go down. ? A bulge that turns red or purple. ? A bulge that is painful to the touch.  You are a man and your scrotum: ? Suddenly feels painful. ? Suddenly changes in size.  You feel sick to your stomach (nauseous) and this feeling does not go away.  You throw up (vomit) and this keeps happening.  You feel your heart  beating a lot more quickly than normal.  You cannot poop (have a bowel movement) or pass gas. This information is not intended to replace advice given to you by your health care provider. Make sure you discuss any questions you have with your health care provider. Document Released: 11/22/2006 Document Revised: 03/29/2016 Document Reviewed: 09/01/2014 Elsevier Interactive Patient Education  [employer].  I have called in naproxen for you for discomfort and scheduled and [place] at [facility]. They will call you to schedule.  If any alarm symptoms present, please go to ED immediately.  Follow up with PCP next week.

## 2017-05-04 NOTE — Progress Notes (Signed)
Patrick Brock , 1976/01/09, 41 y.o., male MRN: [id number] Patient Care Team    Relationship Specialty Notifications Start End  Laurey Morale, MD PCP - General   10/18/10    Comment: Merged    Chief Complaint  Patient presents with  . Groin Pain    left side and left testicle... feeling of being "kicked in the groin" x 1 day...not as painfu today.... describes the pain as achy and intermittent sharp pain....denies urinary Sx....     Subjective: Pt presents for an OV with complaints of scrotal pain  of 1 day duration.  Associated symptoms include left inguinal pain that was initially sharp and radiated to testicle. Today his describes the pain as a dull ache and occasional "tugging". He endorses initially feeling some nausea from the pain yesterday, that resolved. He reports he did carry some heavy camping gear up and down steps the day prior to this occurring but did not feel any injury. He denies any fever, chills, vomit, redness, swelling, dysuria, penile lesions or discharge. He is married and in a monogamous relationship. He has never had a hernia in the past. He had a normal BM today, without pain. He reports he did have a mild UTI a few months ago, but that felt different.   No flowsheet data found.  Allergies  Allergen Reactions  . Erythromycin    Social History  Substance Use Topics  . Smoking status: Never Smoker  . Smokeless tobacco: Never Used  . Alcohol use No   Past Medical History:  Diagnosis Date  . ADHD (attention deficit hyperactivity disorder)   . Allergy    SEASONAL  . Anxiety states   . Esophageal reflux   . Esophagitis   . Hiatal hernia   . Hypertension   . Hypogonadism male   . Low back pain    No past surgical history on file. Family History  Problem Relation Age of Onset  . Cancer Mother        ovarian  . Heart disease Father   . Colon cancer Neg Hx    Allergies as of 05/04/2017      Reactions   Erythromycin       Medication List         Accurate as of 05/04/17  4:25 PM. Always use your most recent med list.          amphetamine-dextroamphetamine 30 MG 24 hr capsule Commonly known as:  ADDERALL XR Take 1 capsule (30 mg total) by mouth 2 (two) times daily.   naproxen 500 MG tablet Commonly known as:  NAPROSYN Take 1 tablet (500 mg total) by mouth 2 (two) times daily with a meal.       All past medical history, surgical history, allergies, family history, immunizations andmedications were updated in the EMR today and reviewed under the history and medication portions of their EMR.     ROS: Negative, with the exception of above mentioned in HPI   Objective:  BP 128/86   Pulse 97   Temp 98.1 F (36.7 C) (Oral)   Wt 188 lb 8 oz (85.5 kg)   SpO2 97%   BMI 30.42 kg/m  Body mass index is 30.42 kg/m. Gen: Afebrile. No acute distress. Nontoxic in appearance, well developed, well nourished. Pleasant caucasian male.  HENT: AT. [place]. MMM, no oral lesions.  CV: RRR Abd: Soft. Flat. NTND. BS presemt. No Masses palpated. No rebound or guarding.  MSK: Skin: No rashes, purpura  or petechiae.  Neuro: Normal gait. PERLA. EOMi. Alert. Oriented x3  Male genitalia: no penile lesions or discharge no testicular masses no bladder distension noted Penis: normal, no lesions and mass: No  Testicles/scrotom: normal, no erythema, bruising, or swelling. no masses. left testicle mild TTP. Mild tenderness left inguinal. No inguinal masses palpated or visualized with pt in the standing or laying flat.  No exam data present No results found. Results for orders placed or performed in visit on 05/04/17 (from the past 24 hour(s))  POCT Urinalysis Dipstick (Automated)     Status: Normal   Collection Time: 05/04/17 11:38 AM  Result Value Ref Range   Color, UA yellow    Clarity, UA clear    Glucose, UA neg    Bilirubin, UA neg    Ketones, UA neg    Spec Grav, UA 1.020 1.010 - 1.025   Blood, UA neg    pH, UA 6.0 5.0 - 8.0    Protein, UA neg    Urobilinogen, UA 0.2 0.2 or 1.0 E.U./dL   Nitrite, UA neg    Leukocytes, UA Negative Negative    Assessment/Plan: Patrick Brock is a 41 y.o. male present for OV for  Acute pain in scrotum Left inguinal pain: - Discussed ddx of groin strain vs inguinal hernia. Other inguinal tenderness, his exam was unremarkable. His urine was normal, and he is not having any bladder or bowel difficulties.Reviewed alarm signs with patient and recommended  if symptoms worsen before we are able to get the [place] completed he is not to wait and report to ED to be evaluated. We are unable to arrange urgent imaging in the Saturday clinic setting and the holiday is coming up possibly delaying ordered image.  - rest. No lifting/squatting.   - naproxen (NAPROSYN) 500 MG tablet; Take 1 tablet (500 mg total) by mouth 2 (two) times daily with a meal.  Dispense: 30 tablet; Refill: 0 - US Scrotum; Future - [place] Art/Ven Flow Abd Pelv Doppler; Future - POCT Urinalysis Dipstick (Automated) - f/u PCP within the next week for eval.  Reviewed expectations re: course of current medical issues.  Discussed self-management of symptoms.  Outlined signs and symptoms indicating need for more acute intervention.  Patient verbalized understanding and all questions were answered.  Patient received an After-Visit Summary.    Orders Placed This Encounter  Procedures  . US Scrotum  . [place] Art/Ven Flow Abd Pelv Doppler  . POCT Urinalysis Dipstick (Automated)     Note is dictated utilizing voice recognition software. Although note has been proof read prior to signing, occasional typographical errors still can be missed. If any questions arise, please do not hesitate to call for verification.   electronically signed by:  Howard Pouch, DO  [place]

## 2017-05-07 ENCOUNTER — Ambulatory Visit
Admission: RE | Admit: 2017-05-07 | Discharge: 2017-05-07 | Disposition: A | Discharge status: Home / Self Care | Payer: 59 | Source: Ambulatory Visit | Attending: Family Medicine | Admitting: Family Medicine

## 2017-05-07 DIAGNOSIS — R1032 Left lower quadrant pain: Secondary | ICD-10-CM

## 2017-05-07 DIAGNOSIS — N5082 Scrotal pain: Secondary | ICD-10-CM

## 2017-05-07 NOTE — Addendum Note (Signed)
Addended by: Howard Pouch A on: 05/07/2017 09:05 AM   Modules accepted: Orders

## 2017-05-09 ENCOUNTER — Telehealth: Payer: Self-pay | Admitting: Family Medicine

## 2017-05-09 NOTE — Telephone Encounter (Signed)
Please call pt: - his [place] results are normal. No signs of inguinal hernia or scrotal/testicular cause to his discomfort.  - continue to treat as pulled muscle with prior recommendations, and follow up with PCP this week if not improving.

## 2017-05-09 NOTE — Telephone Encounter (Signed)
Patient notified and verbalized understanding. 

## 2017-05-09 NOTE — Telephone Encounter (Signed)
Message left for patient to return call.

## 2017-06-10 MED FILL — ADDERALL XR 30 MG CAP SA: 30 | 30 days supply | Qty: 60 | Fill #0

## 2017-07-19 ENCOUNTER — Telehealth: Payer: Self-pay | Admitting: Family Medicine

## 2017-07-19 MED ORDER — AMPHETAMINE-DEXTROAMPHET ER 30 MG PO CP24: 30.0000 mg | ORAL_CAPSULE | Freq: Two times a day (BID) | ORAL | 0 refills | Status: DC

## 2017-07-19 MED FILL — ADDERALL XR 30 MG CAP SA: 30 | 30 days supply | Qty: 60 | Fill #0

## 2017-07-19 NOTE — Telephone Encounter (Signed)
° ° ° ° ° °  Pt request refill of the following:  amphetamine-dextroamphetamine (ADDERALL XR) 30 MG 24 hr capsule   Phamacy:

## 2017-07-19 NOTE — Telephone Encounter (Signed)
Done

## 2017-07-19 NOTE — Telephone Encounter (Signed)
Script is ready for pick up here at front office and I left message.

## 2017-09-13 MED FILL — ADDERALL XR 30 MG CAP SA: 30 | 30 days supply | Qty: 60 | Fill #0

## 2017-09-30 DIAGNOSIS — J0100 Acute maxillary sinusitis, unspecified: Secondary | ICD-10-CM | POA: Diagnosis not present

## 2017-09-30 DIAGNOSIS — J209 Acute bronchitis, unspecified: Secondary | ICD-10-CM | POA: Diagnosis not present

## 2017-09-30 DIAGNOSIS — H66002 Acute suppurative otitis media without spontaneous rupture of ear drum, left ear: Secondary | ICD-10-CM | POA: Diagnosis not present

## 2017-10-03 MED FILL — VENTOLIN HFA 90 MCG INHALER: 108 (90 BAS | 25 days supply | Qty: 18 | Fill #0

## 2017-10-31 MED FILL — ADDERALL XR 30 MG CAP SA: 30 | 30 days supply | Qty: 60 | Fill #0

## 2017-12-10 ENCOUNTER — Telehealth: Payer: Self-pay

## 2017-12-10 NOTE — Telephone Encounter (Signed)
Copied from [place] [id number]. Topic: Quick Communication - Rx Refill/Question >> Dec 10, 2017  9:14 AM Waylan Rocher, Lumin L wrote: Medication: amphetamine-dextroamphetamine (ADDERALL XR) 30 MG 24 hr capsule   Has the patient contacted their pharmacy? No.   (Agent: If no, request that the patient contact the pharmacy for the refill.)   Preferred Pharmacy (with phone number or street name): pick up in office   Agent: Please be advised that RX refills may take up to 3 business days. We ask that you follow-up with your pharmacy.

## 2017-12-11 MED ORDER — AMPHETAMINE-DEXTROAMPHET ER 30 MG PO CP24: 30.0000 mg | ORAL_CAPSULE | Freq: Two times a day (BID) | ORAL | 0 refills | Status: DC

## 2017-12-11 MED FILL — ADDERALL XR 30 MG CAP SA: 30 | 30 days supply | Qty: 60 | Fill #0

## 2017-12-11 NOTE — Telephone Encounter (Signed)
Called pt's pharmacy and had all 3 Rx's for adderall canceled. Sent to PCP to resent to requested pharmacy. Pharmacy was updated [facility]

## 2017-12-11 NOTE — Telephone Encounter (Signed)
Sent to the new pharmacy

## 2017-12-11 NOTE — Telephone Encounter (Signed)
Change in pharmacy

## 2017-12-11 NOTE — Telephone Encounter (Signed)
Last OV 03/08/2017  Rx was last refilled 07/19/2017 disp 60 with 0 refills  Sent to PCP for approval

## 2017-12-11 NOTE — Telephone Encounter (Signed)
Calling back and would like Rx to be sent to [facility], would like a call once sent

## 2017-12-11 NOTE — Telephone Encounter (Signed)
Done

## 2017-12-12 NOTE — Telephone Encounter (Signed)
Called pt and left a VM that we resent his adderall to requested pharmacy.

## 2017-12-30 ENCOUNTER — Ambulatory Visit (INDEPENDENT_AMBULATORY_CARE_PROVIDER_SITE_OTHER): Payer: Self-pay | Admitting: Nurse Practitioner

## 2017-12-30 ENCOUNTER — Encounter: Payer: Self-pay | Admitting: Nurse Practitioner

## 2017-12-30 VITALS — BP 122/90 | HR 95 | Temp 99.0°F | Wt 193.8 lb

## 2017-12-30 DIAGNOSIS — J0110 Acute frontal sinusitis, unspecified: Secondary | ICD-10-CM

## 2017-12-30 MED ORDER — FLUTICASONE PROPIONATE 50 MCG/ACT NA SUSP: 2.0000 | Freq: Every day | NASAL | 0 refills | Status: DC

## 2017-12-30 MED ORDER — MONTELUKAST SODIUM 10 MG PO TABS: 10.0000 mg | ORAL_TABLET | Freq: Every day | ORAL | 1 refills | Status: DC

## 2017-12-30 MED ORDER — AMOXICILLIN-POT CLAVULANATE 875-125 MG PO TABS: 1.0000 | ORAL_TABLET | Freq: Two times a day (BID) | ORAL | 0 refills | Status: AC

## 2017-12-30 NOTE — Addendum Note (Signed)
Addended by: Cari Caraway on: 12/30/2017 06:41 PM   Modules accepted: Orders

## 2017-12-30 NOTE — Progress Notes (Signed)
Subjective:  Patrick Brock is a 42 y.o. male who presents for evaluation of possible sinusitis.  Symptoms include facial pain, nasal congestion, post nasal drip, sinus pressure, sinus pain, sneezing and sore throat.  Onset of symptoms was 5 days ago, and has been gradually worsening since that time.  Treatment to date:  decongestants.  High risk factors for influenza complications:  none.  The following portions of the patient's history were reviewed and updated as appropriate:  allergies, current medications and past medical history.  Constitutional: positive for fatigue, negative for anorexia, chills and malaise Eyes: negative Ears, nose, mouth, throat, and face: positive for nasal congestion, sore throat and left ear pressure/fullness, negative for ear drainage, earaches, hoarseness and snoring Respiratory: positive for cough and mild, negative for asthma, chronic bronchitis, dyspnea on exertion, pneumonia, sputum, stridor and wheezing Cardiovascular: negative Gastrointestinal: positive for nausea, negative for abdominal pain, diarrhea and vomiting Neurological: positive for headaches, negative for coordination problems, dizziness, paresthesia, vertigo and weakness Allergic/Immunologic: positive for hay fever Objective:  BP 122/90   Pulse 95   Temp 99 F (37.2 C)   Wt 193 lb 12.8 oz (87.9 kg)   SpO2 97%   BMI 31.28 kg/m  General appearance: alert, cooperative, fatigued and no distress Head: Normocephalic, without obvious abnormality, atraumatic Eyes: conjunctivae/corneas clear. PERRL, EOM's intact. Fundi benign. Ears: normal TM and external ear canal right ear and abnormal TM left ear - mucoid middle ear fluid Nose: no discharge, turbinates swollen, inflamed, no maxillary sinus tenderness bilateral, moderate frontal sinus tenderness left Throat: lips, mucosa, and tongue normal; teeth and gums normal Lungs: clear to auscultation bilaterally Heart: regular rate and rhythm, S1, S2  normal, no murmur, click, rub or gallop Abdomen: soft, non-tender; bowel sounds normal; no masses,  no organomegaly Pulses: 2+ and symmetric Skin: Skin color, texture, turgor normal. No rashes or lesions Lymph nodes: cervical and submandibular nodes normal Neurologic: Grossly normal    Assessment:  Acute Frontal Sinusitis    Plan:  Discussed diagnosis and treatment of sinusitis. Educational material distributed and questions answered. Suggested symptomatic OTC remedies. Nasal saline spray for congestion. Augmentin per orders. Nasal steroids per orders. Follow up as needed.

## 2017-12-30 NOTE — Patient Instructions (Addendum)

## 2018-01-16 MED FILL — ADDERALL XR 30 MG CAP SA: 30 | 30 days supply | Qty: 60 | Fill #0

## 2018-02-07 ENCOUNTER — Ambulatory Visit (INDEPENDENT_AMBULATORY_CARE_PROVIDER_SITE_OTHER): Payer: No Typology Code available for payment source | Admitting: Family Medicine

## 2018-02-07 ENCOUNTER — Encounter: Payer: Self-pay | Admitting: Family Medicine

## 2018-02-07 VITALS — BP 114/80 | HR 99 | Temp 98.4°F | Ht 65.75 in | Wt 191.2 lb

## 2018-02-07 DIAGNOSIS — Z0000 Encounter for general adult medical examination without abnormal findings: Secondary | ICD-10-CM | POA: Diagnosis not present

## 2018-02-07 LAB — CBC WITH DIFFERENTIAL/PLATELET
BASOS PCT: 0.6 % (ref 0.0–3.0)
Basophils Absolute: 0 10*3/uL (ref 0.0–0.1)
EOS PCT: 1.1 % (ref 0.0–5.0)
Eosinophils Absolute: 0.1 10*3/uL (ref 0.0–0.7)
HEMATOCRIT: 44.1 % (ref 39.0–52.0)
HEMOGLOBIN: 15.3 g/dL (ref 13.0–17.0)
LYMPHS PCT: 31 % (ref 12.0–46.0)
Lymphs Abs: 2.2 10*3/uL (ref 0.7–4.0)
MCHC: 34.6 g/dL (ref 30.0–36.0)
MCV: 89.2 fl (ref 78.0–100.0)
Monocytes Absolute: 0.6 10*3/uL (ref 0.1–1.0)
Monocytes Relative: 8.7 % (ref 3.0–12.0)
Neutro Abs: 4.1 10*3/uL (ref 1.4–7.7)
Neutrophils Relative %: 58.6 % (ref 43.0–77.0)
PLATELETS: 251 10*3/uL (ref 150.0–400.0)
RBC: 4.95 Mil/uL (ref 4.22–5.81)
RDW: 12.8 % (ref 11.5–15.5)
WBC: 6.9 10*3/uL (ref 4.0–10.5)

## 2018-02-07 LAB — POC URINALSYSI DIPSTICK (AUTOMATED)
Bilirubin, UA: NEGATIVE
GLUCOSE UA: NEGATIVE
Ketones, UA: NEGATIVE
Leukocytes, UA: NEGATIVE
NITRITE UA: NEGATIVE
Protein, UA: NEGATIVE
RBC UA: NEGATIVE
Spec Grav, UA: 1.015 (ref 1.010–1.025)
UROBILINOGEN UA: 0.2 U/dL
pH, UA: 7 (ref 5.0–8.0)

## 2018-02-07 LAB — LIPID PANEL
CHOL/HDL RATIO: 4
Cholesterol: 168 mg/dL (ref 0–200)
HDL: 37.8 mg/dL — AB (ref >39.00)
LDL Cholesterol: 109 mg/dL — ABNORMAL HIGH (ref 0–99)
NONHDL: 130.66
TRIGLYCERIDES: 110 mg/dL (ref 0.0–149.0)
VLDL: 22 mg/dL (ref 0.0–40.0)

## 2018-02-07 LAB — HEPATIC FUNCTION PANEL
ALBUMIN: 4.2 g/dL (ref 3.5–5.2)
ALK PHOS: 72 U/L (ref 39–117)
ALT: 32 U/L (ref 0–53)
AST: 20 U/L (ref 0–37)
BILIRUBIN DIRECT: 0.1 mg/dL (ref 0.0–0.3)
TOTAL PROTEIN: 6.6 g/dL (ref 6.0–8.3)
Total Bilirubin: 0.7 mg/dL (ref 0.2–1.2)

## 2018-02-07 LAB — BASIC METABOLIC PANEL
BUN: 11 mg/dL (ref 6–23)
CALCIUM: 9 mg/dL (ref 8.4–10.5)
CO2: 28 meq/L (ref 19–32)
Chloride: 104 mEq/L (ref 96–112)
Creatinine, Ser: 1.05 mg/dL (ref 0.40–1.50)
GFR: 81.73 mL/min (ref >60.00)
GLUCOSE: 95 mg/dL (ref 70–99)
Potassium: 4.1 mEq/L (ref 3.5–5.1)
SODIUM: 139 meq/L (ref 135–145)

## 2018-02-07 LAB — TSH: TSH: 5.25 u[IU]/mL — ABNORMAL HIGH (ref 0.35–4.50)

## 2018-02-07 NOTE — Progress Notes (Signed)
   Subjective:    Patient ID: Patrick Brock, male    DOB: [birth year], 42 y.o.   MRN: [id number]  HPI Here for a well exam. He feels fine. The Adderall still works well.    Review of Systems  Constitutional: Negative.   HENT: Negative.   Eyes: Negative.   Respiratory: Negative.   Cardiovascular: Negative.   Gastrointestinal: Negative.   Genitourinary: Negative.   Musculoskeletal: Negative.   Skin: Negative.   Neurological: Negative.   Psychiatric/Behavioral: Negative.        Objective:   Physical Exam  Constitutional: He is oriented to person, place, and time. He appears well-developed and well-nourished. No distress.  HENT:  Head: Normocephalic and atraumatic.  Right Ear: External ear normal.  Left Ear: External ear normal.  Nose: Nose normal.  Mouth/Throat: Oropharynx is clear and moist. No oropharyngeal exudate.  Eyes: Pupils are equal, round, and reactive to light. Conjunctivae and EOM are normal. Right eye exhibits no discharge. Left eye exhibits no discharge. No scleral icterus.  Neck: Neck supple. No JVD present. No tracheal deviation present. No thyromegaly present.  Cardiovascular: Normal rate, regular rhythm, normal heart sounds and intact distal pulses. Exam reveals no gallop and no friction rub.  No murmur heard. Pulmonary/Chest: Effort normal and breath sounds normal. No respiratory distress. He has no wheezes. He has no rales. He exhibits no tenderness.  Abdominal: Soft. Bowel sounds are normal. He exhibits no distension and no mass. There is no tenderness. There is no rebound and no guarding.  Genitourinary: Rectum normal, prostate normal and penis normal. Rectal exam shows guaiac negative stool. No penile tenderness.  Musculoskeletal: Normal range of motion. He exhibits no edema or tenderness.  Lymphadenopathy:    He has no cervical adenopathy.  Neurological: He is alert and oriented to person, place, and time. He has normal reflexes. No cranial nerve  deficit. He exhibits normal muscle tone. Coordination normal.  Skin: Skin is warm and dry. No rash noted. He is not diaphoretic. No erythema. No pallor.  Psychiatric: He has a normal mood and affect. His behavior is normal. Judgment and thought content normal.          Assessment & Plan:  Well exam. We discussed diet and exercise. Get fasting labs.  Alysia Penna, MD

## 2018-02-20 ENCOUNTER — Other Ambulatory Visit: Payer: Self-pay | Admitting: *Deleted

## 2018-02-20 DIAGNOSIS — R7989 Other specified abnormal findings of blood chemistry: Secondary | ICD-10-CM

## 2018-02-24 MED FILL — ADDERALL XR 30 MG CAP SA: 30 | 30 days supply | Qty: 60 | Fill #0

## 2018-02-25 ENCOUNTER — Other Ambulatory Visit (INDEPENDENT_AMBULATORY_CARE_PROVIDER_SITE_OTHER): Payer: No Typology Code available for payment source

## 2018-02-25 DIAGNOSIS — R7989 Other specified abnormal findings of blood chemistry: Secondary | ICD-10-CM

## 2018-02-25 LAB — T4, FREE: Free T4: 0.69 ng/dL (ref 0.60–1.60)

## 2018-02-25 LAB — T3, FREE: T3, Free: 3.4 pg/mL (ref 2.3–4.2)

## 2018-02-25 LAB — TSH: TSH: 7.36 u[IU]/mL — ABNORMAL HIGH (ref 0.35–4.50)

## 2018-02-26 ENCOUNTER — Telehealth: Payer: Self-pay | Admitting: Family Medicine

## 2018-02-26 NOTE — Telephone Encounter (Signed)
Copied from [place] [id number]. Topic: Inquiry >> Feb 26, 2018  1:13 PM Patrick Brock wrote: Reason for CRM: pt is ready to go over his labs from yesterday when they are ready, call pt when ready

## 2018-02-27 NOTE — Telephone Encounter (Signed)
Pt called back for results.  Advised pt Dr Sarajane Jews is out until Monday. Pt verbalized understanding.

## 2018-03-06 ENCOUNTER — Other Ambulatory Visit: Payer: Self-pay

## 2018-03-06 MED ORDER — LEVOTHYROXINE SODIUM 50 MCG PO TABS: 50.0000 ug | ORAL_TABLET | Freq: Every day | ORAL | 3 refills | Status: DC

## 2018-03-06 MED FILL — LEVOTHYROXINE 50 MCG TABLET: 50 | 90 days supply | Qty: 90 | Fill #0

## 2018-03-28 ENCOUNTER — Other Ambulatory Visit: Payer: Self-pay | Admitting: Family Medicine

## 2018-03-28 MED ORDER — AMPHETAMINE-DEXTROAMPHET ER 30 MG PO CP24: 30.0000 mg | ORAL_CAPSULE | Freq: Two times a day (BID) | ORAL | 0 refills | Status: DC

## 2018-03-28 MED FILL — ADDERALL XR 30 MG CAP SA: 30 | 30 days supply | Qty: 60 | Fill #0

## 2018-03-28 NOTE — Telephone Encounter (Signed)
Done

## 2018-03-28 NOTE — Telephone Encounter (Signed)
Pt last OV was 02/07/2018 and last refill was 12/11/2017 for 60 tablets, please Advise

## 2018-05-02 MED FILL — ADDERALL XR 30 MG CAP SA: 30 | 30 days supply | Qty: 60 | Fill #0

## 2018-06-04 MED FILL — LEVOTHYROXINE 50 MCG TABLET: 50 | 90 days supply | Qty: 90 | Fill #1

## 2018-06-05 MED FILL — ADDERALL XR 30 MG CAP SA: 30 | 30 days supply | Qty: 60 | Fill #0

## 2018-07-16 ENCOUNTER — Other Ambulatory Visit: Payer: Self-pay | Admitting: Family Medicine

## 2018-07-16 MED ORDER — AMPHETAMINE-DEXTROAMPHET ER 30 MG PO CP24: 30.0000 mg | ORAL_CAPSULE | Freq: Two times a day (BID) | ORAL | 0 refills | Status: DC

## 2018-07-16 MED FILL — ADDERALL XR 30 MG CAP SA: 30 | 30 days supply | Qty: 60 | Fill #0

## 2018-07-16 NOTE — Telephone Encounter (Signed)
Done

## 2018-07-16 NOTE — Telephone Encounter (Signed)
Dr. Sarajane Jews, please advise on the refill of the adderrall for the pt.  Thanks

## 2018-08-06 ENCOUNTER — Ambulatory Visit (INDEPENDENT_AMBULATORY_CARE_PROVIDER_SITE_OTHER): Payer: Self-pay | Admitting: Family Medicine

## 2018-08-06 VITALS — BP 124/90 | HR 99 | Temp 98.8°F | Resp 18 | Wt 196.6 lb

## 2018-08-06 DIAGNOSIS — R053 Chronic cough: Secondary | ICD-10-CM

## 2018-08-06 DIAGNOSIS — J40 Bronchitis, not specified as acute or chronic: Secondary | ICD-10-CM

## 2018-08-06 DIAGNOSIS — H938X1 Other specified disorders of right ear: Secondary | ICD-10-CM

## 2018-08-06 DIAGNOSIS — R0981 Nasal congestion: Secondary | ICD-10-CM

## 2018-08-06 DIAGNOSIS — R05 Cough: Secondary | ICD-10-CM

## 2018-08-06 MED ORDER — BENZONATATE 100 MG PO CAPS: 100.0000 mg | ORAL_CAPSULE | Freq: Three times a day (TID) | ORAL | 0 refills | Status: DC | PRN

## 2018-08-06 MED ORDER — PREDNISONE 20 MG PO TABS
20.0000 mg | ORAL_TABLET | Freq: Every day | ORAL | 0 refills | Status: DC
Start: 2018-08-06 — End: 2018-12-05

## 2018-08-06 MED ORDER — HYDROCODONE-HOMATROPINE 5-1.5 MG/5ML PO SYRP: 5.0000 mL | ORAL_SOLUTION | Freq: Three times a day (TID) | ORAL | 0 refills | Status: DC | PRN

## 2018-08-06 MED ORDER — AMOXICILLIN-POT CLAVULANATE 875-125 MG PO TABS: 1.0000 | ORAL_TABLET | Freq: Two times a day (BID) | ORAL | 0 refills | Status: DC

## 2018-08-06 NOTE — Patient Instructions (Signed)
Acute Bronchitis, Adult Acute bronchitis is when air tubes (bronchi) in the lungs suddenly get swollen. The condition can make it hard to breathe. It can also cause these symptoms:  A cough.  Coughing up clear, yellow, or green mucus.  Wheezing.  Chest congestion.  Shortness of breath.  A fever.  Body aches.  Chills.  A sore throat.  Follow these instructions at home: Medicines  Take over-the-counter and prescription medicines only as told by your doctor.  If you were prescribed an antibiotic medicine, take it as told by your doctor. Do not stop taking the antibiotic even if you start to feel better. General instructions  Rest.  Drink enough fluids to keep your pee (urine) clear or pale yellow.  Avoid smoking and secondhand smoke. If you smoke and you need help quitting, ask your doctor. Quitting will help your lungs heal faster.  Use an inhaler, cool mist vaporizer, or humidifier as told by your doctor.  Keep all follow-up visits as told by your doctor. This is important. How is this prevented? To lower your risk of getting this condition again:  Wash your hands often with soap and water. If you cannot use soap and water, use hand sanitizer.  Avoid contact with people who have cold symptoms.  Try not to touch your hands to your mouth, nose, or eyes.  Make sure to get the flu shot every year.  Contact a doctor if:  Your symptoms do not get better in 2 weeks. Get help right away if:  You cough up blood.  You have chest pain.  You have very bad shortness of breath.  You become dehydrated.  You faint (pass out) or keep feeling like you are going to pass out.  You keep throwing up (vomiting).  You have a very bad headache.  Your fever or chills gets worse. This information is not intended to replace advice given to you by your health care provider. Make sure you discuss any questions you have with your health care provider. Document Released:  04/09/2008 Document Revised: 05/30/2016 Document Reviewed: 04/11/2016 Elsevier Interactive Patient Education  2018 [place]. Cough, Adult A cough helps to clear your throat and lungs. A cough may last only 2-3 weeks (acute), or it may last longer than 8 weeks (chronic). Many different things can cause a cough. A cough may be a sign of an illness or another medical condition. Follow these instructions at home:  Pay attention to any changes in your cough.  Take medicines only as told by your doctor. ? If you were prescribed an antibiotic medicine, take it as told by your doctor. Do not stop taking it even if you start to feel better. ? Talk with your doctor before you try using a cough medicine.  Drink enough fluid to keep your pee (urine) clear or pale yellow.  If the air is dry, use a cold steam vaporizer or humidifier in your home.  Stay away from things that make you cough at work or at home.  If your cough is worse at night, try using extra pillows to raise your head up higher while you sleep.  Do not smoke, and try not to be around smoke. If you need help quitting, ask your doctor.  Do not have caffeine.  Do not drink alcohol.  Rest as needed. Contact a doctor if:  You have new problems (symptoms).  You cough up yellow fluid (pus).  Your cough does not get better after 2-3 weeks, or  your cough gets worse.  Medicine does not help your cough and you are not sleeping well.  You have pain that gets worse or pain that is not helped with medicine.  You have a fever.  You are losing weight and you do not know why.  You have night sweats. Get help right away if:  You cough up blood.  You have trouble breathing.  Your heartbeat is very fast. This information is not intended to replace advice given to you by your health care provider. Make sure you discuss any questions you have with your health care provider. Document Released: 07/05/2011 Document Revised:  03/29/2016 Document Reviewed: 12/29/2014 Elsevier Interactive Patient Education  [employer].

## 2018-08-06 NOTE — Progress Notes (Signed)
.  Patient ID: Patrick Brock, male    DOB: [birth year], 42 y.o.   MRN: [id number]  PCP: Laurey Morale, MD  Chief Complaint  Patient presents with  . cough    Subjective:  HPI  Patrick Brock is a 42 y.o. male presents for evaluation of persistent cough. Cough has been present for intermittently for 2 months although he has felt poorly over the last 2 weeks.  Complains of congestion (worst upon awakening), cough-dry, non-productive, right ear pain/pressure, mild shortness of breath with coughing. History of allergies. Not currently taking medication for allergies. He has taken otc cough suppressants and  left benzonatate without significant relief of symptoms. No fever, body aches, chest tightness, headaches,  or weakness. Social History   Socioeconomic History  . Marital status: Married    Spouse name: Not on file  . Number of children: Not on file  . Years of education: Not on file  . Highest education level: Not on file  Occupational History  . Occupation: Systems analyst: [employer]  . Financial resource strain: Not on file  . Food insecurity:    Worry: Not on file    Inability: Not on file  . Transportation needs:    Medical: Not on file    Non-medical: Not on file  Tobacco Use  . Smoking status: Never Smoker  . Smokeless tobacco: Never Used  Substance and Sexual Activity  . Alcohol use: No    Alcohol/week: 0.0 standard drinks  . Drug use: No  . Sexual activity: Not on file  Lifestyle  . Physical activity:    Days per week: Not on file    Minutes per session: Not on file  . Stress: Not on file  Relationships  . Social connections:    Talks on phone: Not on file    Gets together: Not on file    Attends religious service: Not on file    Active member of club or organization: Not on file    Attends meetings of clubs or organizations: Not on file    Relationship status: Not on file  . Intimate partner violence:    Fear of current or  ex partner: Not on file    Emotionally abused: Not on file    Physically abused: Not on file    Forced sexual activity: Not on file  Other Topics Concern  . Not on file  Social History Narrative  . Not on file    Family History  Problem Relation Age of Onset  . Cancer Mother        ovarian  . Heart disease Father   . Colon cancer Neg Hx    Review of Systems See HPI  Patient Active Problem List   Diagnosis Date Noted  . Acute pain in scrotum 05/04/2017  . Left inguinal pain 05/04/2017  . 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 GERD (gastroesophageal reflux disease) 06/05/2011  . Musculoskeletal pain 04/12/2011  . VIRAL URI 09/13/2010  . COSTOCHONDRITIS 09/13/2010  . HYPOGONADISM 07/14/2010  . GLOSSITIS 07/14/2010  . MYALGIA 05/15/2010  . WEAKNESS 05/15/2010  . ADHD 06/28/2009  . HEMATURIA UNSPECIFIED 06/28/2009  . ANXIETY 08/08/2007  . LOW BACK PAIN 08/08/2007    Allergies  Allergen Reactions  . Erythromycin     Prior to Admission medications   Medication Sig Start Date End Date Taking? Authorizing Provider  amphetamine-dextroamphetamine (ADDERALL XR) 30 MG 24 hr capsule Take 1 capsule (30  mg total) by mouth 2 (two) times daily. 09/15/18 10/15/18 Yes Laurey Morale, MD  levothyroxine (SYNTHROID, LEVOTHROID) 50 MCG tablet Take 1 tablet (50 mcg total) by mouth daily. 03/06/18  Yes Laurey Morale, MD  fluticasone (FLONASE) 50 MCG/ACT nasal spray Place 2 sprays into both nostrils daily for 10 days. 12/30/17 01/09/18  Kara Dies, NP    Past Medical, Surgical Family and Social History reviewed and updated.    Objective:   Today's Vitals   08/06/18 1817  BP: 124/90  Pulse: 99  Resp: 18  Temp: 98.8 F (37.1 C)  TempSrc: Oral  SpO2: 98%  Weight: 196 lb 9.6 oz (89.2 kg)    Wt Readings from Last 3 Encounters:  08/06/18 196 lb 9.6 oz (89.2 kg)  02/07/18 191 lb 3.2 oz (86.7 kg)  12/30/17 193 lb 12.8 oz (87.9 kg)     Physical Exam General appearance:  alert, well developed, well nourished, cooperative and in no distress Head: Normocephalic, without obvious abnormality, atraumatic. Oropharynx is clear. Right ear MEE present. Respiratory: Cough persistent, hacky occurring throughout exam. Respirations even and unlabored, normal respiratory rate. Lung sounds otherwise normal. Extremities: No gross deformities Skin: Skin color, texture, turgor normal. No rashes seen  Psych: Appropriate mood and affect. Neurologic: Mental status: Alert, oriented to person, place, and time, thought content appropriate.  Assessment & Plan:  1. Persistent cough, suspect underlying cause was initially allergies. Given the length of time cough has remained present and he is now symptomatic and feeling poorly, I will treat with oral steroid to decrease bronchial inflammation which is likely causing cough to worsen. Hycodan cough suppressant for nighttime use only and benzonatate during the day.  2. Bronchitis, will treat empiric with broad-spectrum antibiotic. Augmentin 1 tablet, twice daily x 10 days    3. Nasal congestion 4. Congestion of right ear Should improve or resolve with prednisone therapy.   If symptoms worsen or do not improve, return for follow-up, follow-up with PCP, or at the emergency department if severity of symptoms warrant a higher level of care.    Carroll Sage. Kenton Kingfisher, MSN, FNP-C [address] # [place]  [place], [place] [address] [phone]

## 2018-08-08 ENCOUNTER — Telehealth: Payer: Self-pay

## 2018-08-08 NOTE — Telephone Encounter (Signed)
I left a message to the patient asking to call us back. 

## 2018-08-15 MED FILL — LEVOTHYROXINE 50 MCG TABLET: 50 | 90 days supply | Qty: 90 | Fill #2

## 2018-08-27 ENCOUNTER — Ambulatory Visit (INDEPENDENT_AMBULATORY_CARE_PROVIDER_SITE_OTHER): Payer: Self-pay | Admitting: Nurse Practitioner

## 2018-08-27 VITALS — BP 136/86 | HR 95 | Temp 99.5°F | Resp 20 | Wt 195.0 lb

## 2018-08-27 DIAGNOSIS — J209 Acute bronchitis, unspecified: Secondary | ICD-10-CM

## 2018-08-27 DIAGNOSIS — H6983 Other specified disorders of Eustachian tube, bilateral: Secondary | ICD-10-CM

## 2018-08-27 MED ORDER — BENZONATATE 200 MG PO CAPS: 200.0000 mg | ORAL_CAPSULE | Freq: Three times a day (TID) | ORAL | 0 refills | Status: AC | PRN

## 2018-08-27 MED ORDER — PREDNISONE 10 MG (21) PO TBPK: ORAL_TABLET | ORAL | 0 refills | Status: AC

## 2018-08-27 MED ORDER — DOXYCYCLINE HYCLATE 100 MG PO TABS: 100.0000 mg | ORAL_TABLET | Freq: Two times a day (BID) | ORAL | 0 refills | Status: DC

## 2018-08-27 NOTE — Patient Instructions (Addendum)
Acute Bronchitis, Adult -Take medications as prescribed. -Ibuprofen or Tylenol for pain, fever, or general discomfort. -Increase fluids. -Sleep elevated on at least 2 pillows at bedtime. -Use humidifier or vaporizer when at home and during sleep. -Follow-up as discussed in 5 to 7 days.  If symptoms do not improve, may suggest following up with PCP for chest x-ray or determining if this is more related to acid reflux. Follow-up in our clinic as needed.   Acute bronchitis is sudden (acute) swelling of the air tubes (bronchi) in the lungs. Acute bronchitis causes these tubes to fill with mucus, which can make it hard to breathe. It can also cause coughing or wheezing. In adults, acute bronchitis usually goes away within 2 weeks. A cough caused by bronchitis may last up to 3 weeks. Smoking, allergies, and asthma can make the condition worse. Repeated episodes of bronchitis may cause further lung problems, such as chronic obstructive pulmonary disease (COPD). What are the causes? This condition can be caused by germs and by substances that irritate the lungs, including:  Cold and flu viruses. This condition is most often caused by the same virus that causes a cold.  Bacteria.  Exposure to tobacco smoke, dust, fumes, and air pollution.  What increases the risk? This condition is more likely to develop in people who:  Have close contact with someone with acute bronchitis.  Are exposed to lung irritants, such as tobacco smoke, dust, fumes, and vapors.  Have a weak immune system.  Have a respiratory condition such as asthma.  What are the signs or symptoms? Symptoms of this condition include:  A cough.  Coughing up clear, yellow, or green mucus.  Wheezing.  Chest congestion.  Shortness of breath.  A fever.  Body aches.  Chills.  A sore throat.  How is this diagnosed? This condition is usually diagnosed with a physical exam. During the exam, your health care provider may  order tests, such as chest X-rays, to rule out other conditions. He or she may also:  Test a sample of your mucus for bacterial infection.  Check the level of oxygen in your blood. This is done to check for pneumonia.  Do a chest X-ray or lung function testing to rule out pneumonia and other conditions.  Perform blood tests.  Your health care provider will also ask about your symptoms and medical history. How is this treated? Most cases of acute bronchitis clear up over time without treatment. Your health care provider may recommend:  Drinking more fluids. Drinking more makes your mucus thinner, which may make it easier to breathe.  Taking a medicine for a fever or cough.  Taking an antibiotic medicine.  Using an inhaler to help improve shortness of breath and to control a cough.  Using a cool mist vaporizer or humidifier to make it easier to breathe.  Follow these instructions at home: Medicines  Take over-the-counter and prescription medicines only as told by your health care provider.  If you were prescribed an antibiotic, take it as told by your health care provider. Do not stop taking the antibiotic even if you start to feel better. General instructions  Get plenty of rest.  Drink enough fluids to keep your urine clear or pale yellow.  Avoid smoking and secondhand smoke. Exposure to cigarette smoke or irritating chemicals will make bronchitis worse. If you smoke and you need help quitting, ask your health care provider. Quitting smoking will help your lungs heal faster.  Use an inhaler, cool mist  vaporizer, or humidifier as told by your health care provider.  Keep all follow-up visits as told by your health care provider. This is important. How is this prevented? To lower your risk of getting this condition again:  Wash your hands often with soap and water. If soap and water are not available, use hand sanitizer.  Avoid contact with people who have cold  symptoms.  Try not to touch your hands to your mouth, nose, or eyes.  Make sure to get the flu shot every year.  Contact a health care provider if:  Your symptoms do not improve in 2 weeks of treatment. Get help right away if:  You cough up blood.  You have chest pain.  You have severe shortness of breath.  You become dehydrated.  You faint or keep feeling like you are going to faint.  You keep vomiting.  You have a severe headache.  Your fever or chills gets worse. This information is not intended to replace advice given to you by your health care provider. Make sure you discuss any questions you have with your health care provider. Document Released: 11/29/2004 Document Revised: 05/16/2016 Document Reviewed: 04/11/2016 Elsevier Interactive Patient Education  2018 [place].  Eustachian Tube Dysfunction The eustachian tube connects the middle ear to the back of the nose. It regulates air pressure in the middle ear by allowing air to move between the ear and nose. It also helps to drain fluid from the middle ear space. When the eustachian tube does not function properly, air pressure, fluid, or both can build up in the middle ear. Eustachian tube dysfunction can affect one or both ears. What are the causes? This condition happens when the eustachian tube becomes blocked or cannot open normally. This may result from:  Ear infections.  Colds and other upper respiratory infections.  Allergies.  Irritation, such as from cigarette smoke or acid from the stomach coming up into the esophagus (gastroesophageal reflux).  Sudden changes in air pressure, such as from descending in an airplane.  Abnormal growths in the nose or throat, such as nasal polyps, tumors, or enlarged tissue at the back of the throat (adenoids).  What increases the risk? This condition may be more likely to develop in people who smoke and people who are overweight. Eustachian tube dysfunction may also  be more likely to develop in children, especially children who have:  Certain birth defects of the mouth, such as cleft palate.  Large tonsils and adenoids.  What are the signs or symptoms? Symptoms of this condition may include:  A feeling of fullness in the ear.  Ear pain.  Clicking or popping noises in the ear.  Ringing in the ear.  Hearing loss.  Loss of balance.  Symptoms may get worse when the air pressure around you changes, such as when you travel to an area of high elevation or fly on an airplane. How is this diagnosed? This condition may be diagnosed based on:  Your symptoms.  A physical exam of your ear, nose, and throat.  Tests, such as those that measure: ? The movement of your eardrum (tympanogram). ? Your hearing (audiometry).  How is this treated? Treatment depends on the cause and severity of your condition. If your symptoms are mild, you may be able to relieve your symptoms by moving air into ("popping") your ears. If you have symptoms of fluid in your ears, treatment may include:  Decongestants.  Antihistamines.  Nasal sprays or ear drops that contain  medicines that reduce swelling (steroids).  In some cases, you may need to have a procedure to drain the fluid in your eardrum (myringotomy). In this procedure, a small tube is placed in the eardrum to:  Drain the fluid.  Restore the air in the middle ear space.  Follow these instructions at home:  Take over-the-counter and prescription medicines only as told by your health care provider.  Use techniques to help pop your ears as recommended by your health care provider. These may include: ? Chewing gum. ? Yawning. ? Frequent, forceful swallowing. ? Closing your mouth, holding your nose closed, and gently blowing as if you are trying to blow air out of your nose.  Do not do any of the following until your health care provider approves: ? Travel to high altitudes. ? Fly in airplanes. ? Work  in a Pension scheme manager or room. ? Scuba dive.  Keep your ears dry. Dry your ears completely after showering or bathing.  Do not smoke.  Keep all follow-up visits as told by your health care provider. This is important. Contact a health care provider if:  Your symptoms do not go away after treatment.  Your symptoms come back after treatment.  You are unable to pop your ears.  You have: ? A fever. ? Pain in your ear. ? Pain in your head or neck. ? Fluid draining from your ear.  Your hearing suddenly changes.  You become very dizzy.  You lose your balance. This information is not intended to replace advice given to you by your health care provider. Make sure you discuss any questions you have with your health care provider. Document Released: 11/18/2015 Document Revised: 03/29/2016 Document Reviewed: 11/10/2014 Elsevier Interactive Patient Education  [employer].

## 2018-08-27 NOTE — Progress Notes (Signed)
Subjective:     Patrick Brock is a 42 y.o. male here for evaluation of a cough.  The cough is non-productive, with wheezing, chest is painful during coughing, waxing and waning over time and is aggravated by nothing. Onset of symptoms was several weeks ago, gradually worsening since that time.  The patient was seen in our clinic on 10/2 and at that time given a prescription for Augmentin, prednisone, and Tessalon Perles.  The patient states that after the third day of the medications, he started to feel better.  Patient states once he finished the steroids, he felt that his symptoms were starting to worsen again.  Patient states that it seems his cough is worse at night, and patient also informs that he notices the cough once he is eating and after he eats.  The patient states he does take medicines for acid reflux, but does not take them on a daily basis.  Associated symptoms include chest pain, postnasal drip and wheezing. Patient does not have a history of asthma. Patient has not had recent travel. Patient does not have a history of smoking.   The following portions of the patient's history were reviewed and updated as appropriate: allergies, current medications and past medical history.  Review of Systems Constitutional: positive for anorexia and fatigue, negative for chills, fevers and sweats Eyes: negative Ears, nose, mouth, throat, and face: positive for sore throat and bilateral ear fullness/pressure, negative for ear drainage, nasal congestion and sore mouth Respiratory: positive for cough, dyspnea on exertion, pleurisy/chest pain and wheezing, negative for asthma, chronic bronchitis, hemoptysis and pneumonia Cardiovascular: negative Gastrointestinal: positive for nausea d/t postnasal drip, negative for abdominal pain, constipation, diarrhea and vomiting Neurological: positive for dizziness, negative for coordination problems, paresthesia, seizures, speech problems, tremors, vertigo and  weakness Allergic/Immunologic: positive for hay fever     Objective:    BP 136/86 (BP Location: Right Arm, Patient Position: Sitting, Cuff Size: Normal)   Pulse 95   Temp 99.5 F (37.5 C) (Oral)   Resp 20   Wt 195 lb (88.5 kg)   SpO2 98%   BMI 31.71 kg/m  General appearance: alert, cooperative, fatigued, mild distress and d/t cough Head: Normocephalic, without obvious abnormality, atraumatic Eyes: conjunctivae/corneas clear. PERRL, EOM's intact. Fundi benign. Ears: abnormal TM right ear - mucoid middle ear fluid and abnormal TM left ear - mucoid middle ear fluid Nose: no discharge, mild maxillary sinus tenderness bilateral, no frontal sinus tenderness bilateral Throat: abnormal findings: moderate oropharyngeal erythema and tonsils +1 bilaterally, no exudates Lungs: clear to auscultation bilaterally Heart: regular rate and rhythm, S1, S2 normal, no murmur, click, rub or gallop Abdomen: soft, non-tender; bowel sounds normal; no masses,  no organomegaly Pulses: 2+ and symmetric Skin: Skin color, texture, turgor normal. No rashes or lesions Lymph nodes: cervical and submandibular nodes normal Neurologic: Grossly normal    Assessment:    Acute Bronchitis    Plan:   Exam findings, diagnosis etiology and medication use and indications reviewed with patient. Follow- Up and discharge instructions provided. No emergent/urgent issues found on exam.  The patient at length his symptomology.  Informed patient that this could also be related to acid reflux based on the history provided.  I informed patient that I was not opposed to attempting another round of antibiotics, to be able to rule out any respiratory conditions.  However, inform patient that if cough persist after this round of antibiotics, and steroids, I would like him to follow-up with his primary  care doctor to have a chest x-ray and to completely rule out acid reflux if this is the case.  Patient was in agreement with this plan of  care.  Patient will follow-up with me within the next 5 to 7 days to provide an update on his symptoms with this treatment regimen.  Patient education was provided. Patient verbalized understanding of information provided and agrees with plan of care (POC), all questions answered. The patient is advised to call or return to clinic if condition does not see an improvement in symptoms, or to seek the care of the closest emergency department if condition worsens with the above plan.    1. Acute bronchitis, unspecified organism  - doxycycline (VIBRA-TABS) 100 MG tablet; Take 1 tablet (100 mg total) by mouth 2 (two) times daily.  Dispense: 20 tablet; Refill: 0 - predniSONE (STERAPRED UNI-PAK 21 TAB) 10 MG (21) TBPK tablet; Take as directed.  Dispense: 21 tablet; Refill: 0 - benzonatate (TESSALON) 200 MG capsule; Take 1 capsule (200 mg total) by mouth 3 (three) times daily as needed for up to 10 days for cough.  Dispense: 20 capsule; Refill: 0 -Take medications as prescribed. -Ibuprofen or Tylenol for pain, fever, or general discomfort. -Increase fluids. -Sleep elevated on at least 2 pillows at bedtime. -Use humidifier or vaporizer when at home and during sleep. -Follow-up as discussed in 5 to 7 days.  If symptoms do not improve, may suggest following up with PCP for chest x-ray or determining if this is more related to acid reflux. Follow-up in our clinic as needed.  2. Acute dysfunction of Eustachian tube, bilateral  - predniSONE (STERAPRED UNI-PAK 21 TAB) 10 MG (21) TBPK tablet; Take as directed.  Dispense: 21 tablet; Refill: 0

## 2018-09-01 MED FILL — ADDERALL XR 30 MG CAP SA: 30 | 30 days supply | Qty: 60 | Fill #0

## 2018-10-07 ENCOUNTER — Ambulatory Visit (INDEPENDENT_AMBULATORY_CARE_PROVIDER_SITE_OTHER): Payer: No Typology Code available for payment source | Admitting: Family Medicine

## 2018-10-07 ENCOUNTER — Ambulatory Visit (INDEPENDENT_AMBULATORY_CARE_PROVIDER_SITE_OTHER): Payer: No Typology Code available for payment source

## 2018-10-07 ENCOUNTER — Encounter: Payer: Self-pay | Admitting: Family Medicine

## 2018-10-07 VITALS — BP 128/90 | HR 94 | Temp 100.0°F | Wt 198.3 lb

## 2018-10-07 DIAGNOSIS — M79674 Pain in right toe(s): Secondary | ICD-10-CM

## 2018-10-07 MED ORDER — METHYLPREDNISOLONE 4 MG PO TBPK: ORAL_TABLET | ORAL | 0 refills | Status: DC

## 2018-10-07 MED FILL — METHYLPREDNISOLONE 4 MG TAB: 4 | 6 days supply | Qty: 21 | Fill #0

## 2018-10-07 NOTE — Progress Notes (Signed)
   Subjective:    Patient ID: Patrick Brock, male    DOB: May 22, 1976, 42 y.o.   MRN: [id number]  HPI Here for 2 weeks of pain in the base of the right great toe. No recent trauma but this pain started several days after he spent a long time on a ladder at his house. It never swelled but it did feel hot at times. The pain waxes and wanes. He notes that his sister has gout.    Review of Systems  Constitutional: Negative.   Respiratory: Negative.   Cardiovascular: Negative.   Musculoskeletal: Positive for arthralgias.       Objective:   Physical Exam  Constitutional: He appears well-developed and well-nourished.  Limping   Cardiovascular: Normal rate, regular rhythm, normal heart sounds and intact distal pulses.  Pulmonary/Chest: Effort normal and breath sounds normal.  Musculoskeletal:  The right great toe appears normal, no swelling or erythema or warmth. He is tender at the MTP joint   The Xrays of the toe appear normal.         Assessment & Plan:  Probable gout, treat with a Medrol dose pack. Recheck prn. Alysia Penna, MD

## 2018-10-15 ENCOUNTER — Telehealth: Payer: Self-pay | Admitting: Family Medicine

## 2018-10-15 DIAGNOSIS — M79676 Pain in unspecified toe(s): Secondary | ICD-10-CM

## 2018-10-15 NOTE — Telephone Encounter (Signed)
Copied from [id number]. Topic: Quick Communication - See Telephone Encounter >> Oct 15, 2018  1:27 PM R[place]ford Nail, [place] wrote: CRM for notification. See Telephone encounter for: 10/15/18. Patient calling and states that he saw Dr Sarajane Jews on 10/07/18 for his toe pain and was started on a sterroid. States that he has now finished the medication and nothing has changed with how it looks or how it feels. States that it is still painful and he does not know what the next step is. Would like to know what Dr Sarajane Jews would like for him to do.  CB#: [phone]

## 2018-10-16 NOTE — Telephone Encounter (Signed)
Dr. Fry please advise. Thanks  

## 2018-10-16 NOTE — Telephone Encounter (Signed)
Pt calling back to see what Dr Sarajane Jews would have him do next. Pt would like a call back aso soon as you can.

## 2018-10-17 MED ORDER — DICLOFENAC SODIUM 75 MG PO TBEC: 75.0000 mg | DELAYED_RELEASE_TABLET | Freq: Two times a day (BID) | ORAL | 1 refills | Status: DC | PRN

## 2018-10-17 NOTE — Telephone Encounter (Signed)
The referral was done  

## 2018-10-17 NOTE — Telephone Encounter (Signed)
Called and spoke to the pt and he is aware of meds that have been sent to the pharmacy.  He would like the referral for podiatry to Dr. Wylene Simmer at [facility] ortho please.

## 2018-10-17 NOTE — Telephone Encounter (Signed)
Call in Diclofenac 75 mg to take bid prn pain, #60 with one rf. If he wishes I can also refer him to Podiatry

## 2018-10-30 MED FILL — ADDERALL XR 30 MG CAP SA: 30 | 30 days supply | Qty: 60 | Fill #0

## 2018-11-12 MED FILL — LEVOTHYROXINE 50 MCG TABLET: 50 | 90 days supply | Qty: 90 | Fill #3

## 2018-11-17 ENCOUNTER — Other Ambulatory Visit: Payer: Self-pay | Admitting: Orthopedic Surgery

## 2018-11-17 ENCOUNTER — Other Ambulatory Visit (HOSPITAL_COMMUNITY): Payer: Self-pay | Admitting: Orthopedic Surgery

## 2018-11-17 DIAGNOSIS — M79671 Pain in right foot: Secondary | ICD-10-CM

## 2018-11-21 ENCOUNTER — Ambulatory Visit (HOSPITAL_COMMUNITY)
Admission: RE | Admit: 2018-11-21 | Discharge: 2018-11-21 | Disposition: A | Discharge status: Home / Self Care | Payer: No Typology Code available for payment source | Source: Ambulatory Visit | Attending: Orthopedic Surgery | Admitting: Orthopedic Surgery

## 2018-11-21 DIAGNOSIS — M79671 Pain in right foot: Secondary | ICD-10-CM | POA: Diagnosis not present

## 2018-12-05 ENCOUNTER — Encounter: Payer: Self-pay | Admitting: Family Medicine

## 2018-12-05 ENCOUNTER — Ambulatory Visit (INDEPENDENT_AMBULATORY_CARE_PROVIDER_SITE_OTHER): Payer: No Typology Code available for payment source | Admitting: Family Medicine

## 2018-12-05 VITALS — BP 124/84 | HR 88 | Temp 98.7°F | Wt 197.1 lb

## 2018-12-05 DIAGNOSIS — E039 Hypothyroidism, unspecified: Secondary | ICD-10-CM | POA: Diagnosis not present

## 2018-12-05 DIAGNOSIS — R5383 Other fatigue: Secondary | ICD-10-CM

## 2018-12-05 DIAGNOSIS — Z23 Encounter for immunization: Secondary | ICD-10-CM

## 2018-12-05 LAB — HEPATIC FUNCTION PANEL
ALT: 49 U/L (ref 0–53)
AST: 25 U/L (ref 0–37)
Albumin: 4.4 g/dL (ref 3.5–5.2)
Alkaline Phosphatase: 65 U/L (ref 39–117)
Bilirubin, Direct: 0.1 mg/dL (ref 0.0–0.3)
Total Bilirubin: 0.6 mg/dL (ref 0.2–1.2)
Total Protein: 6.7 g/dL (ref 6.0–8.3)

## 2018-12-05 LAB — CBC WITH DIFFERENTIAL/PLATELET
Basophils Absolute: 0.1 10*3/uL (ref 0.0–0.1)
Basophils Relative: 0.8 % (ref 0.0–3.0)
Eosinophils Absolute: 0.1 10*3/uL (ref 0.0–0.7)
Eosinophils Relative: 1.5 % (ref 0.0–5.0)
HCT: 44.7 % (ref 39.0–52.0)
Hemoglobin: 15.2 g/dL (ref 13.0–17.0)
Lymphocytes Relative: 31.1 % (ref 12.0–46.0)
Lymphs Abs: 2.1 10*3/uL (ref 0.7–4.0)
MCHC: 34.1 g/dL (ref 30.0–36.0)
MCV: 89.3 fl (ref 78.0–100.0)
MONO ABS: 0.5 10*3/uL (ref 0.1–1.0)
Monocytes Relative: 7.2 % (ref 3.0–12.0)
Neutro Abs: 4 10*3/uL (ref 1.4–7.7)
Neutrophils Relative %: 59.4 % (ref 43.0–77.0)
Platelets: 250 10*3/uL (ref 150.0–400.0)
RBC: 5.01 Mil/uL (ref 4.22–5.81)
RDW: 13 % (ref 11.5–15.5)
WBC: 6.8 10*3/uL (ref 4.0–10.5)

## 2018-12-05 LAB — TSH: TSH: 2.5 u[IU]/mL (ref 0.35–4.50)

## 2018-12-05 LAB — BASIC METABOLIC PANEL
BUN: 16 mg/dL (ref 6–23)
CO2: 28 mEq/L (ref 19–32)
CREATININE: 0.99 mg/dL (ref 0.40–1.50)
Calcium: 9.6 mg/dL (ref 8.4–10.5)
Chloride: 100 mEq/L (ref 96–112)
GFR: 81.99 mL/min (ref >60.00)
Glucose, Bld: 87 mg/dL (ref 70–99)
Potassium: 3.9 mEq/L (ref 3.5–5.1)
Sodium: 134 mEq/L — ABNORMAL LOW (ref 135–145)

## 2018-12-05 LAB — VITAMIN B12: Vitamin B-12: 419 pg/mL (ref 211–911)

## 2018-12-05 LAB — T3, FREE: T3 FREE: 3.6 pg/mL (ref 2.3–4.2)

## 2018-12-05 LAB — T4, FREE: Free T4: 1.03 ng/dL (ref 0.60–1.60)

## 2018-12-05 NOTE — Progress Notes (Signed)
   Subjective:    Patient ID: Patrick Brock, male    DOB: 04-18-1976, 43 y.o.   MRN: [id number]  HPI Here complaining of several months of generalized fatigue, and for a few weeks he has had some numbness in the hands and feet. No recent illness. We started him on Synthroid last April but he never followed up until today. His weight is stable.    Review of Systems  Constitutional: Positive for fatigue. Negative for appetite change and unexpected weight change.  Respiratory: Negative.   Cardiovascular: Negative.   Endocrine: Negative.   Neurological: Positive for numbness.       Objective:   Physical Exam Constitutional:      Appearance: Normal appearance.  Neck:     Musculoskeletal: No muscular tenderness.  Cardiovascular:     Rate and Rhythm: Normal rate and regular rhythm.     Pulses: Normal pulses.     Heart sounds: Normal heart sounds.  Pulmonary:     Effort: Pulmonary effort is normal.     Breath sounds: Normal breath sounds.  Lymphadenopathy:     Cervical: No cervical adenopathy.  Neurological:     General: No focal deficit present.     Mental Status: He is alert and oriented to person, place, and time.           Assessment & Plan:  I suspect his fatigue is due to thyroid deficiency but he may have a B12 issue as well. We will screen with labs today.  Alysia Penna, MD

## 2018-12-31 ENCOUNTER — Other Ambulatory Visit: Payer: Self-pay | Admitting: Family Medicine

## 2018-12-31 NOTE — Telephone Encounter (Signed)
Dr. Fry please advise on refills. Thanks  

## 2019-01-05 MED ORDER — AMPHETAMINE-DEXTROAMPHET ER 30 MG PO CP24: ORAL_CAPSULE | ORAL | 0 refills | Status: DC

## 2019-01-05 MED FILL — ADDERALL XR 30 MG CAP SA: 30 | 30 days supply | Qty: 60 | Fill #0

## 2019-01-05 NOTE — Telephone Encounter (Signed)
Done

## 2019-01-31 ENCOUNTER — Other Ambulatory Visit: Payer: Self-pay | Admitting: Family Medicine

## 2019-02-05 MED FILL — LEVOTHYROXINE 50 MCG TABLET: 50 | 90 days supply | Qty: 90 | Fill #0

## 2019-02-19 MED FILL — ADDERALL XR 30 MG CAP SA: 30 | 30 days supply | Qty: 60 | Fill #0

## 2019-04-01 MED FILL — ADDERALL XR 30 MG CAP SA: 30 | 30 days supply | Qty: 60 | Fill #0

## 2019-05-05 ENCOUNTER — Ambulatory Visit (INDEPENDENT_AMBULATORY_CARE_PROVIDER_SITE_OTHER): Payer: Self-pay | Admitting: Physician Assistant

## 2019-05-05 ENCOUNTER — Other Ambulatory Visit: Payer: Self-pay

## 2019-05-05 ENCOUNTER — Encounter: Payer: Self-pay | Admitting: Physician Assistant

## 2019-05-05 VITALS — BP 122/92 | HR 98 | Temp 98.3°F | Resp 18 | Wt 197.0 lb

## 2019-05-05 DIAGNOSIS — R51 Headache: Secondary | ICD-10-CM

## 2019-05-05 DIAGNOSIS — R519 Headache, unspecified: Secondary | ICD-10-CM

## 2019-05-05 NOTE — Progress Notes (Signed)
Patrick Brock  MRN: [id number] DOB: 08-Apr-1976  Subjective:  Patrick Brock is a 43 y.o. male seen in office today for a chief complaint of headache x 1 day. Has had b/l ear pain for the past 1-2 weeks. Today, while at work, developed a severe frontal headache.  Decided to come be evaluated.   Location: across forehead Gradual onset: yes Waxing/waning yes  Associated Symptoms Nausea: yes Vomiting: no Photophobia/phonophobia: "maybe a little bothersome when looking at bright light and with noise- everything seems hypersensitive at this point" Sinus pain/pressure: yes, feels pressure in right side of sinus  Family hx migraine: no  Personal stressors: denied, does have PMH of anxiety and panic attacks-reports the blurred vision and dizziness are typical for his panic attacks    Red Flags Fever: no  Neck pain/stiffness: no  Vision/speech/swallow/hearing difficulty: On his way to his car prior to this visit, he developed blurred vision, dizziness, and nausea. This has since resolved. Denies trouble swallowing, speech changes, and hearing difficulty Focal weakness/numbness: no; has been having intermittent tingling sensation in b/l hands for the past 3-4 days. Denies any current numbness, tingling, weakness, or speech changes.  Altered mental status: no  Trauma: no  New type of headache: yes; has had occasional headaches in the past but they usually just go away with tylenol. He feels like he waited to long today and this one is much more severe than any headache he has had in the past- rates it a 9/10. Describes it as throbbing. Denies thunderclap sensation. Has not taken anything for it. Denies PMH of migraines.  Anticoagulant use: no  H/o cancer/HIV/Pregnancy: no   Denies any recent travel. Denies exposure to covid + patient. PMH of anxiety, ADHD, seasonal allergies, and hypothyroidism. Denies PMH of DM, HTN, and HLD. Denies smoking. Concerned that because he works with the public  that this could be covid-19.   Review of Systems  Constitutional: Negative for diaphoresis and fatigue.  HENT: Negative for congestion, ear discharge, postnasal drip, rhinorrhea, sneezing, sore throat and trouble swallowing.   Eyes: Negative for discharge.  Respiratory: Negative for cough and shortness of breath.   Cardiovascular: Negative for chest pain, palpitations and leg swelling.  Gastrointestinal: Negative for abdominal pain.  Musculoskeletal: Negative for gait problem, neck pain and neck stiffness.  Neurological: Negative for facial asymmetry and speech difficulty.  Psychiatric/Behavioral: Negative for confusion. The patient is nervous/anxious.     Patient Active Problem List   Diagnosis Date Noted  . Hypothyroidism 12/05/2018  . Acute pain in scrotum 05/04/2017  . Left inguinal pain 05/04/2017  . 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 GERD (gastroesophageal reflux disease) 06/05/2011  . Musculoskeletal pain 04/12/2011  . VIRAL URI 09/13/2010  . COSTOCHONDRITIS 09/13/2010  . HYPOGONADISM 07/14/2010  . GLOSSITIS 07/14/2010  . MYALGIA 05/15/2010  . WEAKNESS 05/15/2010  . ADHD 06/28/2009  . HEMATURIA UNSPECIFIED 06/28/2009  . ANXIETY 08/08/2007  . LOW BACK PAIN 08/08/2007    Current Outpatient Medications on File Prior to Visit  Medication Sig Dispense Refill  . amphetamine-dextroamphetamine (ADDERALL XR) 30 MG 24 hr capsule TAKE 1 CAPSULE (30 MG TOTAL) BY MOUTH 2 (TWO) TIMES DAILY. 60 capsule 0  . levothyroxine (SYNTHROID, LEVOTHROID) 50 MCG tablet TAKE 1 TABLET (50 MCG TOTAL) BY MOUTH DAILY. 90 tablet 3  . diclofenac (VOLTAREN) 75 MG EC tablet Take 1 tablet (75 mg total) by mouth 2 (two) times daily as needed. (Patient not taking: Reported on 05/05/2019) 60 tablet 1  No current facility-administered medications on file prior to visit.     Allergies  Allergen Reactions  . Erythromycin Nausea Only    At age 43---abd cramping     Objective:  BP (!) 122/92   Pulse  98   Temp 98.3 F (36.8 C)   Resp 18   Wt 197 lb (89.4 kg)   SpO2 99%   BMI 32.04 kg/m   Physical Exam Vitals signs reviewed.  Constitutional:      General: He is not in acute distress.    Appearance: He is well-developed. He is not toxic-appearing or diaphoretic.     Comments: Sitting in dim lit room-appears uncomfortable, occasionally squinting eyes and rubbing forehead.   HENT:     Head: Normocephalic and atraumatic.     Right Ear: Hearing, tympanic membrane, ear canal and external ear normal. No tenderness. No middle ear effusion. No mastoid tenderness. Tympanic membrane is not erythematous or bulging.     Left Ear: Hearing, tympanic membrane, ear canal and external ear normal. No tenderness.  No middle ear effusion. No mastoid tenderness. Tympanic membrane is not erythematous or bulging.     Nose: Nose normal. No mucosal edema, congestion or rhinorrhea.     Right Sinus: No maxillary sinus tenderness or frontal sinus tenderness.     Left Sinus: No maxillary sinus tenderness or frontal sinus tenderness.     Mouth/Throat:     Lips: Pink.     Pharynx: Oropharynx is clear. Uvula midline.  Eyes:     General: Lids are normal.     Extraocular Movements: Extraocular movements intact.     Conjunctiva/sclera: Conjunctivae normal.     Pupils: Pupils are equal, round, and reactive to light.     Funduscopic exam:    Right eye: No hemorrhage, AV nicking or papilledema.        Left eye: No hemorrhage, AV nicking or papilledema.     Comments: No photophobia b/l.  Neck:     Musculoskeletal: Full passive range of motion without pain and normal range of motion. No muscular tenderness.     Trachea: Phonation normal.     Meningeal: Brudzinski's sign absent.  Cardiovascular:     Rate and Rhythm: Normal rate and regular rhythm.     Pulses:          Radial pulses are 2+ on the right side and 2+ on the left side.     Heart sounds: Normal heart sounds.  Pulmonary:     Effort: Pulmonary effort  is normal.     Breath sounds: Normal breath sounds.  Lymphadenopathy:     Head:     Right side of head: No submental, submandibular, tonsillar, preauricular, posterior auricular or occipital adenopathy.     Left side of head: No submental, submandibular, tonsillar, preauricular, posterior auricular or occipital adenopathy.     Cervical: No cervical adenopathy.     Upper Body:     Right upper body: No supraclavicular adenopathy.     Left upper body: No supraclavicular adenopathy.  Skin:    General: Skin is warm and dry.  Neurological:     Mental Status: He is alert and oriented to person, place, and time.     Cranial Nerves: No cranial nerve deficit.     Sensory: No sensory deficit.     Coordination: Romberg sign negative. Finger-Nose-Finger Test normal. Rapid alternating movements normal.     Gait: Gait is intact.     Deep Tendon Reflexes: Reflexes  are normal and symmetric.     Comments: Mental Status:  Alert, thought content appropriate, able to give a coherent history. Speech fluent without evidence of aphasia. Able to follow 2 step commands without difficulty.  Cranial Nerves:  II: pupils equal, round, reactive to light III,IV, VI: ptosis not present, extra-ocular motions intact bilaterally  V,VII: smile symmetric, facial light touch sensation equal VIII: hearing grossly normal to voice  X: uvula elevates symmetrically  XI: bilateral shoulder shrug symmetric and strong XII: midline tongue extension without fassiculations Motor:  Normal tone. 5/5 strength of BUE and BLE major muscle groups including strong and equal grip strength and dorsiflexion/plantar flexion Sensory: light touch normal in all extremities. Gait: normal gait and balance. Normal tandem walk. CV: 2+ radial pulses   Psychiatric:        Mood and Affect: Mood is anxious.     Assessment and Plan :  1. Acute intractable headache, unspecified headache type This case was precepted with Dr. Sabra Heck. Pt appears  like he does not feel well but is in NAD. Diastolic BP mildly elevated at 122/92. Otherwise, VS WNL. No acute findings noted on neuro exam. Normal HEENT exam.  Considering this is a new type of headache for patient and is more severe than any prior headache, along with intermittent blurred vision, nausea, and dizziness (which are not present currently) would recommend immediate evaluation by ED for potential life threatening etiologies. Pt drove himself here today. Would recommend emergent transportation via EMS but pt declines. He called his wife to come pick him up and transport him to ED via personal vehicle. Wife arrived and agreed to transport pt immediately to ED for further evaluation and management.     [facility] 05/05/2019 1:39 PM

## 2019-05-05 NOTE — Patient Instructions (Signed)
Due to the severity of your headache along with your other symptoms, I recommend you go to the ED immediately for further evaluation and management.

## 2019-05-06 ENCOUNTER — Encounter: Payer: Self-pay | Admitting: Emergency Medicine

## 2019-05-06 NOTE — Telephone Encounter (Signed)
Attempted to contact patient following up on visit with [facility] per Provider. Left message to contact our office

## 2019-05-08 ENCOUNTER — Telehealth: Payer: Self-pay

## 2019-05-08 NOTE — Telephone Encounter (Signed)
Patient states he is feeling good.

## 2019-05-15 MED FILL — LEVOTHYROXINE 50 MCG TABLET: 50 | 90 days supply | Qty: 90 | Fill #0

## 2019-06-10 ENCOUNTER — Other Ambulatory Visit: Payer: Self-pay | Admitting: Family Medicine

## 2019-06-10 MED ORDER — AMPHETAMINE-DEXTROAMPHET ER 30 MG PO CP24: 30.0000 mg | ORAL_CAPSULE | Freq: Two times a day (BID) | ORAL | 0 refills | Status: DC

## 2019-06-10 NOTE — Telephone Encounter (Signed)
Done

## 2019-06-10 NOTE — Telephone Encounter (Signed)
Last filled 03/07/2019 Last OV 12/05/2018  Ok to fill?

## 2019-06-11 MED FILL — ADDERALL XR 30 MG CAP SA: 30 | 30 days supply | Qty: 60 | Fill #0

## 2019-07-08 ENCOUNTER — Encounter: Payer: Self-pay | Admitting: Family Medicine

## 2019-07-08 ENCOUNTER — Other Ambulatory Visit: Payer: Self-pay

## 2019-07-08 ENCOUNTER — Ambulatory Visit (INDEPENDENT_AMBULATORY_CARE_PROVIDER_SITE_OTHER): Payer: No Typology Code available for payment source | Admitting: Family Medicine

## 2019-07-08 VITALS — BP 110/64 | HR 99 | Temp 97.6°F | Wt 197.2 lb

## 2019-07-08 DIAGNOSIS — Z0000 Encounter for general adult medical examination without abnormal findings: Secondary | ICD-10-CM

## 2019-07-08 DIAGNOSIS — E039 Hypothyroidism, unspecified: Secondary | ICD-10-CM | POA: Diagnosis not present

## 2019-07-08 LAB — CBC WITH DIFFERENTIAL/PLATELET
Basophils Absolute: 0 10*3/uL (ref 0.0–0.1)
Basophils Relative: 0.4 % (ref 0.0–3.0)
Eosinophils Absolute: 0.1 10*3/uL (ref 0.0–0.7)
Eosinophils Relative: 1.5 % (ref 0.0–5.0)
HCT: 42.7 % (ref 39.0–52.0)
Hemoglobin: 14.6 g/dL (ref 13.0–17.0)
Lymphocytes Relative: 28.2 % (ref 12.0–46.0)
Lymphs Abs: 1.8 10*3/uL (ref 0.7–4.0)
MCHC: 34.1 g/dL (ref 30.0–36.0)
MCV: 91.3 fl (ref 78.0–100.0)
Monocytes Absolute: 0.5 10*3/uL (ref 0.1–1.0)
Monocytes Relative: 8 % (ref 3.0–12.0)
Neutro Abs: 4 10*3/uL (ref 1.4–7.7)
Neutrophils Relative %: 61.9 % (ref 43.0–77.0)
Platelets: 232 10*3/uL (ref 150.0–400.0)
RBC: 4.68 Mil/uL (ref 4.22–5.81)
RDW: 12.8 % (ref 11.5–15.5)
WBC: 6.5 10*3/uL (ref 4.0–10.5)

## 2019-07-08 LAB — BASIC METABOLIC PANEL
BUN: 15 mg/dL (ref 6–23)
CO2: 28 mEq/L (ref 19–32)
Calcium: 9 mg/dL (ref 8.4–10.5)
Chloride: 104 mEq/L (ref 96–112)
Creatinine, Ser: 1.04 mg/dL (ref 0.40–1.50)
GFR: 77.25 mL/min (ref >60.00)
Glucose, Bld: 91 mg/dL (ref 70–99)
Potassium: 4.1 mEq/L (ref 3.5–5.1)
Sodium: 138 mEq/L (ref 135–145)

## 2019-07-08 LAB — LIPID PANEL
Cholesterol: 174 mg/dL (ref 0–200)
HDL: 44.1 mg/dL (ref >39.00)
LDL Cholesterol: 114 mg/dL — ABNORMAL HIGH (ref 0–99)
NonHDL: 129.41
Total CHOL/HDL Ratio: 4
Triglycerides: 75 mg/dL (ref 0.0–149.0)
VLDL: 15 mg/dL (ref 0.0–40.0)

## 2019-07-08 LAB — POC URINALSYSI DIPSTICK (AUTOMATED)
Bilirubin, UA: NEGATIVE
Blood, UA: NEGATIVE
Glucose, UA: NEGATIVE
Ketones, UA: NEGATIVE
Leukocytes, UA: NEGATIVE
Nitrite, UA: NEGATIVE
Protein, UA: NEGATIVE
Spec Grav, UA: 1.015 (ref 1.010–1.025)
Urobilinogen, UA: 0.2 E.U./dL
pH, UA: 6 (ref 5.0–8.0)

## 2019-07-08 LAB — T4, FREE: Free T4: 0.9 ng/dL (ref 0.60–1.60)

## 2019-07-08 LAB — HEPATIC FUNCTION PANEL
ALT: 28 U/L (ref 0–53)
AST: 20 U/L (ref 0–37)
Albumin: 4.3 g/dL (ref 3.5–5.2)
Alkaline Phosphatase: 66 U/L (ref 39–117)
Bilirubin, Direct: 0.1 mg/dL (ref 0.0–0.3)
Total Bilirubin: 0.6 mg/dL (ref 0.2–1.2)
Total Protein: 6.7 g/dL (ref 6.0–8.3)

## 2019-07-08 LAB — T3, FREE: T3, Free: 3.2 pg/mL (ref 2.3–4.2)

## 2019-07-08 LAB — TSH: TSH: 4.25 u[IU]/mL (ref 0.35–4.50)

## 2019-07-08 NOTE — Progress Notes (Signed)
   Subjective:    Patient ID: Patrick Brock, male    DOB: 10-26-1976, 43 y.o.   MRN: [id number]  HPI Here for a well exam. He feels fine.    Review of Systems  Constitutional: Negative.   HENT: Negative.   Eyes: Negative.   Respiratory: Negative.   Cardiovascular: Negative.   Gastrointestinal: Negative.   Genitourinary: Negative.   Musculoskeletal: Negative.   Skin: Negative.   Neurological: Negative.   Psychiatric/Behavioral: Negative.        Objective:   Physical Exam Constitutional:      General: He is not in acute distress.    Appearance: He is well-developed. He is not diaphoretic.  HENT:     Head: Normocephalic and atraumatic.     Right Ear: External ear normal.     Left Ear: External ear normal.     Nose: Nose normal.     Mouth/Throat:     Pharynx: No oropharyngeal exudate.  Eyes:     General: No scleral icterus.       Right eye: No discharge.        Left eye: No discharge.     Conjunctiva/sclera: Conjunctivae normal.     Pupils: Pupils are equal, round, and reactive to light.  Neck:     Musculoskeletal: Neck supple.     Thyroid: No thyromegaly.     Vascular: No JVD.     Trachea: No tracheal deviation.  Cardiovascular:     Rate and Rhythm: Normal rate and regular rhythm.     Heart sounds: Normal heart sounds. No murmur. No friction rub. No gallop.   Pulmonary:     Effort: Pulmonary effort is normal. No respiratory distress.     Breath sounds: Normal breath sounds. No wheezing or rales.  Chest:     Chest wall: No tenderness.  Abdominal:     General: Bowel sounds are normal. There is no distension.     Palpations: Abdomen is soft. There is no mass.     Tenderness: There is no abdominal tenderness. There is no guarding or rebound.  Genitourinary:    Penis: Normal. No tenderness.      Scrotum/Testes: Normal.  Musculoskeletal: Normal range of motion.        General: No tenderness.  Lymphadenopathy:     Cervical: No cervical adenopathy.  Skin:  General: Skin is warm and dry.     Coloration: Skin is not pale.     Findings: No erythema or rash.  Neurological:     Mental Status: He is alert and oriented to person, place, and time.     Cranial Nerves: No cranial nerve deficit.     Motor: No abnormal muscle tone.     Coordination: Coordination normal.     Deep Tendon Reflexes: Reflexes are normal and symmetric. Reflexes normal.  Psychiatric:        Behavior: Behavior normal.        Thought Content: Thought content normal.        Judgment: Judgment normal.           Assessment & Plan:  Well exam. We discussed diet and exercise. Get fasting labs.  Alysia Penna, MD

## 2019-07-31 MED FILL — ADDERALL XR 30 MG CAP SA: 30 | 30 days supply | Qty: 60 | Fill #0

## 2019-08-31 MED FILL — ADDERALL XR 30 MG CAP SA: 30 | 30 days supply | Qty: 60 | Fill #0

## 2019-08-31 MED FILL — LEVOTHYROXINE 50 MCG TABLET: 50 | 90 days supply | Qty: 90 | Fill #1

## 2019-10-20 ENCOUNTER — Other Ambulatory Visit: Payer: Self-pay | Admitting: Family Medicine

## 2019-10-20 NOTE — Telephone Encounter (Signed)
Last filled 08/10/2019 Last OV 07/08/2019  Ok to fill?

## 2019-10-21 MED ORDER — AMPHETAMINE-DEXTROAMPHET ER 30 MG PO CP24: 30.0000 mg | ORAL_CAPSULE | Freq: Two times a day (BID) | ORAL | 0 refills | Status: DC

## 2019-10-21 MED FILL — ADDERALL XR 30 MG CAP SA: 30 | 30 days supply | Qty: 60 | Fill #0

## 2019-10-21 NOTE — Telephone Encounter (Signed)
Done

## 2019-10-26 ENCOUNTER — Encounter: Payer: Self-pay | Admitting: Family Medicine

## 2019-10-26 ENCOUNTER — Telehealth (INDEPENDENT_AMBULATORY_CARE_PROVIDER_SITE_OTHER): Payer: No Typology Code available for payment source | Admitting: Family Medicine

## 2019-10-26 ENCOUNTER — Other Ambulatory Visit: Payer: Self-pay

## 2019-10-26 DIAGNOSIS — M542 Cervicalgia: Secondary | ICD-10-CM | POA: Diagnosis not present

## 2019-10-26 MED ORDER — CYCLOBENZAPRINE HCL 10 MG PO TABS: 10.0000 mg | ORAL_TABLET | Freq: Three times a day (TID) | ORAL | 2 refills | Status: DC | PRN

## 2019-10-26 MED ORDER — METHYLPREDNISOLONE 4 MG PO TBPK: ORAL_TABLET | ORAL | 0 refills | Status: DC

## 2019-10-26 MED FILL — METHYLPREDNISOLONE 4 MG TBP: 4 | 6 days supply | Qty: 21 | Fill #0

## 2019-10-26 MED FILL — CYCLOBENZAPRINE HCL 10 MG T: 10 | 20 days supply | Qty: 60 | Fill #0

## 2019-10-26 NOTE — Progress Notes (Signed)
Virtual Visit via Video Note  I connected with the patient on 10/26/19 at  4:00 PM EST by a video enabled telemedicine application and verified that I am speaking with the correct person using two identifiers.  Location patient: home Location provider:work or home office Persons participating in the virtual visit: patient, provider  I discussed the limitations of evaluation and management by telemedicine and the availability of in person appointments. The patient expressed understanding and agreed to proceed.   HPI: Here for one week of stiffness and pain in the right neck and this extends to the right shoulder. He woke up with this one morning. No recent trauma. Using heat and ice along with Ibuprofen.    ROS: See pertinent positives and negatives per HPI.  Past Medical History:  Diagnosis Date  . ADHD (attention deficit hyperactivity disorder)   . Allergy    SEASONAL  . Anxiety states   . Esophageal reflux   . Esophagitis   . Hiatal hernia   . Hypertension   . Hypogonadism male   . Low back pain     No past surgical history on file.  Family History  Problem Relation Age of Onset  . Cancer Mother        ovarian  . Heart disease Father   . Colon cancer Neg Hx      Current Outpatient Medications:  .  [START ON 12/22/2019] amphetamine-dextroamphetamine (ADDERALL XR) 30 MG 24 hr capsule, Take 1 capsule (30 mg total) by mouth 2 (two) times daily., Disp: 60 capsule, Rfl: 0 .  cyclobenzaprine (FLEXERIL) 10 MG tablet, Take 1 tablet (10 mg total) by mouth 3 (three) times daily as needed for muscle spasms., Disp: 60 tablet, Rfl: 2 .  diclofenac (VOLTAREN) 75 MG EC tablet, Take 1 tablet (75 mg total) by mouth 2 (two) times daily as needed., Disp: 60 tablet, Rfl: 1 .  levothyroxine (SYNTHROID, LEVOTHROID) 50 MCG tablet, TAKE 1 TABLET (50 MCG TOTAL) BY MOUTH DAILY., Disp: 90 tablet, Rfl: 3 .  methylPREDNISolone (MEDROL DOSEPAK) 4 MG TBPK tablet, As directed, Disp: 21 tablet, Rfl:  0  EXAM:  VITALS per patient if applicable:  GENERAL: alert, oriented, appears well and in no acute distress  HEENT: atraumatic, conjunttiva clear, no obvious abnormalities on inspection of external nose and ears  NECK: normal movements of the head and neck  LUNGS: on inspection no signs of respiratory distress, breathing rate appears normal, no obvious gross SOB, gasping or wheezing  CV: no obvious cyanosis  MS: moves all visible extremities without noticeable abnormality  PSYCH/NEURO: pleasant and cooperative, no obvious depression or anxiety, speech and thought processing grossly intact  ASSESSMENT AND PLAN: Neck pain. He will try a Medrol dose pack and Flexeril as needed. Recheck prn. Alysia Penna, MD  Discussed the following assessment and plan:  No diagnosis found.     I discussed the assessment and treatment plan with the patient. The patient was provided an opportunity to ask questions and all were answered. The patient agreed with the plan and demonstrated an understanding of the instructions.   The patient was advised to call back or seek an in-person evaluation if the symptoms worsen or if the condition fails to improve as anticipated.

## 2019-10-27 ENCOUNTER — Encounter: Payer: Self-pay | Admitting: Family Medicine

## 2019-10-27 ENCOUNTER — Ambulatory Visit (INDEPENDENT_AMBULATORY_CARE_PROVIDER_SITE_OTHER): Payer: No Typology Code available for payment source | Admitting: Family Medicine

## 2019-10-27 ENCOUNTER — Telehealth: Payer: Self-pay | Admitting: Family Medicine

## 2019-10-27 ENCOUNTER — Other Ambulatory Visit: Payer: Self-pay

## 2019-10-27 VITALS — BP 140/64 | HR 102 | Temp 97.7°F | Wt 201.2 lb

## 2019-10-27 DIAGNOSIS — M542 Cervicalgia: Secondary | ICD-10-CM

## 2019-10-27 MED ORDER — HYDROCODONE-ACETAMINOPHEN 10-325 MG PO TABS: 1.0000 | ORAL_TABLET | ORAL | 0 refills | Status: AC | PRN

## 2019-10-27 MED FILL — HYDROCODON-APAP 10-325: 10-325 | 5 days supply | Qty: 30 | Fill #0

## 2019-10-27 NOTE — Telephone Encounter (Signed)
Spoke with patient's wife. Appointment has been made for today. Nothing further needed.

## 2019-10-27 NOTE — Telephone Encounter (Signed)
Pts mother called stating that the pt is in worse shape this morning than he was yesterday. Pts wife states that he cant move his right arm without it feeling like its catching. Pt says he is not able to sit down or lay down without a lot of pain. Please advise.

## 2019-10-27 NOTE — Progress Notes (Signed)
   Subjective:    Patient ID: Patrick Brock, male    DOB: 1976-06-23, 43 y.o.   MRN: [id number]  HPI Here for worsening neck pain that radiates down to the right shoulder. He woke up with this pain about 8 days ago, no recent trauma. The pain has become severe and he has started to hold his right arm up against his body to keep it still. We met with him virtually yesterday and he was started on a Medrol dose pack and Flexeril, but this has not helped the pain. He has some weakness in the right arm and hand, but no numbness. I see in his chart he had an MRI of the cervical spine per Dr. 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   Review of Systems  Constitutional: Negative.   Respiratory: Negative.   Cardiovascular: Negative.   Musculoskeletal: Positive for back pain.  Neurological: Positive for weakness. Negative for numbness.       Objective:   Physical Exam Constitutional:      Comments: In obvious pain, walks very slowly and does not turn his head   Cardiovascular:     Rate and Rhythm: Normal rate and regular rhythm.     Pulses: Normal pulses.     Heart sounds: Normal heart sounds.  Pulmonary:     Effort: Pulmonary effort is normal.     Breath sounds: Normal breath sounds.  Musculoskeletal:     Comments: He is very tender in the lower posterior neck with almost no ROM of the cervical spine. He guards tightly as I start to move the neck.            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 He will continue with the steroid taper and the Flexeril. We will add Norco for the pain. Set up a cervical spine MRI soon.  Alysia Penna, MD

## 2019-10-27 NOTE — Telephone Encounter (Signed)
Set up an in person exam so I can examine him better, maybe get Xrays

## 2019-10-31 ENCOUNTER — Encounter: Payer: Self-pay | Admitting: Family Medicine

## 2019-11-12 ENCOUNTER — Other Ambulatory Visit: Payer: Self-pay

## 2019-11-12 ENCOUNTER — Ambulatory Visit
Admission: RE | Admit: 2019-11-12 | Discharge: 2019-11-12 | Disposition: A | Discharge status: Home / Self Care | Payer: No Typology Code available for payment source | Source: Ambulatory Visit | Attending: Family Medicine | Admitting: Family Medicine

## 2019-11-12 DIAGNOSIS — M542 Cervicalgia: Secondary | ICD-10-CM

## 2019-11-15 NOTE — Addendum Note (Signed)
Addended by: Alysia Penna A on: 11/15/2019 02:04 PM   Modules accepted: Orders

## 2019-11-23 MED FILL — ADDERALL XR 30 MG CAP SA: 30 | 30 days supply | Qty: 60 | Fill #0

## 2019-11-23 MED FILL — LEVOTHYROXINE 50 MCG TABLET: 50 | 90 days supply | Qty: 90 | Fill #2

## 2019-12-22 MED FILL — ADDERALL XR 30 MG CAP SA: 30 | 30 days supply | Qty: 60 | Fill #0

## 2020-02-01 ENCOUNTER — Other Ambulatory Visit: Payer: Self-pay | Admitting: Family Medicine

## 2020-02-01 NOTE — Telephone Encounter (Signed)
Last Rx given on 2/16 for #60

## 2020-02-03 MED ORDER — AMPHETAMINE-DEXTROAMPHET ER 30 MG PO CP24: 30.0000 mg | ORAL_CAPSULE | Freq: Two times a day (BID) | ORAL | 0 refills | Status: DC

## 2020-02-03 MED FILL — ADDERALL XR 30 MG CAP SA: 30 | 30 days supply | Qty: 60 | Fill #0

## 2020-02-03 NOTE — Telephone Encounter (Signed)
Done

## 2020-03-09 ENCOUNTER — Other Ambulatory Visit: Payer: Self-pay | Admitting: Family Medicine

## 2020-03-10 ENCOUNTER — Telehealth: Payer: Self-pay | Admitting: Family Medicine

## 2020-03-10 ENCOUNTER — Other Ambulatory Visit: Payer: Self-pay | Admitting: Family Medicine

## 2020-03-10 MED FILL — LEVOTHYROXINE SODIUM 50 MCG: 50 | 90 days supply | Qty: 90 | Fill #0

## 2020-03-10 NOTE — Telephone Encounter (Signed)
Pt is requesting a refill on Synthroid 50 mcg tablet. Per pt, the pharmacy needs an approval of manufacturing change. Pt uses [employer], [employer]. Thanks

## 2020-03-11 ENCOUNTER — Telehealth: Payer: Self-pay | Admitting: Family Medicine

## 2020-03-11 NOTE — Telephone Encounter (Signed)
Tell them it is okay to change

## 2020-03-11 NOTE — Telephone Encounter (Signed)
Refill send in yesterday. Burt for pharmacy to use a [employer] representative?

## 2020-03-11 NOTE — Telephone Encounter (Signed)
Yes it is okay to switch manufacturers

## 2020-03-11 NOTE — Telephone Encounter (Signed)
[name] from pt pharmacy is needing verbal approval to change levothyroxine from current manufacture to [employer] because current no longer offers medication.

## 2020-03-11 NOTE — Telephone Encounter (Signed)
Pharmacy is aware

## 2020-03-15 NOTE — Telephone Encounter (Signed)
Spoke with pharmacy. They are aware to fill the medication with the manufacturer that is available.

## 2020-03-21 ENCOUNTER — Telehealth: Payer: Self-pay | Admitting: Family Medicine

## 2020-03-21 NOTE — Telephone Encounter (Signed)
Pt is requesting the referral that was done on 11/15/19 be faxed to [place]. Pt has scheduled an appt and they are saying, they don't have the referral. Please fax to attn: [facility] Address: [address] [place], [place] [address] Phone: [phone] Fax [phone]

## 2020-03-22 NOTE — Telephone Encounter (Signed)
Please fax this referral as requested

## 2020-03-30 MED FILL — ADDERALL XR 30 MG CAP SA: 30 | 30 days supply | Qty: 60 | Fill #0

## 2020-05-23 MED FILL — ADDERALL XR 30 MG CAP SA: 30 | 30 days supply | Qty: 60 | Fill #0

## 2020-06-06 MED FILL — LEVOTHYROXINE SODIUM 50 MCG: 50 | 90 days supply | Qty: 90 | Fill #1

## 2020-07-08 ENCOUNTER — Encounter: Payer: Self-pay | Admitting: Family Medicine

## 2020-07-08 ENCOUNTER — Ambulatory Visit (INDEPENDENT_AMBULATORY_CARE_PROVIDER_SITE_OTHER): Payer: No Typology Code available for payment source | Admitting: Family Medicine

## 2020-07-08 ENCOUNTER — Other Ambulatory Visit: Payer: Self-pay

## 2020-07-08 VITALS — BP 122/64 | HR 88 | Temp 99.2°F | Ht 65.75 in | Wt 197.4 lb

## 2020-07-08 DIAGNOSIS — E039 Hypothyroidism, unspecified: Secondary | ICD-10-CM | POA: Diagnosis not present

## 2020-07-08 DIAGNOSIS — M25512 Pain in left shoulder: Secondary | ICD-10-CM | POA: Diagnosis not present

## 2020-07-08 DIAGNOSIS — G8929 Other chronic pain: Secondary | ICD-10-CM | POA: Diagnosis not present

## 2020-07-08 DIAGNOSIS — Z0000 Encounter for general adult medical examination without abnormal findings: Secondary | ICD-10-CM

## 2020-07-08 MED ORDER — AMPHETAMINE-DEXTROAMPHET ER 30 MG PO CP24: 30.0000 mg | ORAL_CAPSULE | Freq: Two times a day (BID) | ORAL | 0 refills | Status: DC

## 2020-07-08 MED FILL — ADDERALL XR 30 MG CAP SA: 30 | 90 days supply | Qty: 180 | Fill #0

## 2020-07-08 NOTE — Progress Notes (Signed)
Subjective:    Patient ID: Patrick Brock, male    DOB: December 10, 1975, 44 y.o.   MRN: [id number]  HPI Here for a well exam. He has a few issues to discuss. First he has had pain in the left shoulder for several months. This is worst when he tries to raise his arm above his head. It started when he tried to catch a piece of furniture that was falling off a truck. Also he saw his dentist this week and they found a white spot in his mouth that they were worried about. They referred him to someone to biopsy the site, he asks me to look at this. It is not symptomatic.    Review of Systems  Constitutional: Negative.   HENT: Negative.   Eyes: Negative.   Respiratory: Negative.   Cardiovascular: Negative.   Gastrointestinal: Negative.   Genitourinary: Negative.   Musculoskeletal: Positive for arthralgias.  Skin: Negative.   Neurological: Negative.   Psychiatric/Behavioral: Negative.        Objective:   Physical Exam Constitutional:      General: He is not in acute distress.    Appearance: He is well-developed. He is not diaphoretic.  HENT:     Head: Normocephalic and atraumatic.     Right Ear: External ear normal.     Left Ear: External ear normal.     Nose: Nose normal.     Mouth/Throat:     Pharynx: No oropharyngeal exudate.     Comments: There is a white thin line along the left mouth in the groove between the lower left gum line and the cheek  Eyes:     General: No scleral icterus.       Right eye: No discharge.        Left eye: No discharge.     Conjunctiva/sclera: Conjunctivae normal.     Pupils: Pupils are equal, round, and reactive to light.  Neck:     Thyroid: No thyromegaly.     Vascular: No JVD.     Trachea: No tracheal deviation.  Cardiovascular:     Rate and Rhythm: Normal rate and regular rhythm.     Heart sounds: Normal heart sounds. No murmur heard.  No friction rub. No gallop.   Pulmonary:     Effort: Pulmonary effort is normal. No respiratory distress.      Breath sounds: Normal breath sounds. No wheezing or rales.  Chest:     Chest wall: No tenderness.  Abdominal:     General: Bowel sounds are normal. There is no distension.     Palpations: Abdomen is soft. There is no mass.     Tenderness: There is no abdominal tenderness. There is no guarding or rebound.  Genitourinary:    Penis: Normal. No tenderness.      Testes: Normal.  Musculoskeletal:        General: Normal range of motion.     Cervical back: Neck supple.     Comments: Left shoulder is tender in the anterior portion and ROM is limited by pain   Lymphadenopathy:     Cervical: No cervical adenopathy.  Skin:    General: Skin is warm and dry.     Coloration: Skin is not pale.     Findings: No erythema or rash.  Neurological:     Mental Status: He is alert and oriented to person, place, and time.     Cranial Nerves: No cranial nerve deficit.     Motor: No  abnormal muscle tone.     Coordination: Coordination normal.     Deep Tendon Reflexes: Reflexes are normal and symmetric. Reflexes normal.  Psychiatric:        Behavior: Behavior normal.        Thought Content: Thought content normal.        Judgment: Judgment normal.           Assessment & Plan:  Well exam. We discussed diet and exercise. Get fasting labs. He seems to have a rotator cuff injury in the left shoulder, so we will refer him to Orthopedics. 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  Alysia Penna, MD

## 2020-07-09 LAB — HEPATIC FUNCTION PANEL
AG Ratio: 2 (calc) (ref 1.0–2.5)
ALT: 31 U/L (ref 9–46)
AST: 19 U/L (ref 10–40)
Albumin: 4.4 g/dL (ref 3.6–5.1)
Alkaline phosphatase (APISO): 67 U/L (ref 36–130)
Bilirubin, Direct: 0.1 mg/dL (ref 0.0–0.2)
Globulin: 2.2 g/dL (calc) (ref 1.9–3.7)
Indirect Bilirubin: 0.4 mg/dL (calc) (ref 0.2–1.2)
Total Bilirubin: 0.5 mg/dL (ref 0.2–1.2)
Total Protein: 6.6 g/dL (ref 6.1–8.1)

## 2020-07-09 LAB — BASIC METABOLIC PANEL
BUN: 13 mg/dL (ref 7–25)
CO2: 26 mmol/L (ref 20–32)
Calcium: 9.1 mg/dL (ref 8.6–10.3)
Chloride: 104 mmol/L (ref 98–110)
Creat: 1.09 mg/dL (ref 0.60–1.35)
Glucose, Bld: 97 mg/dL (ref 65–99)
Potassium: 4.4 mmol/L (ref 3.5–5.3)
Sodium: 139 mmol/L (ref 135–146)

## 2020-07-09 LAB — CBC WITH DIFFERENTIAL/PLATELET
Absolute Monocytes: 523 cells/uL (ref 200–950)
Basophils Absolute: 38 cells/uL (ref 0–200)
Basophils Relative: 0.6 %
Eosinophils Absolute: 82 cells/uL (ref 15–500)
Eosinophils Relative: 1.3 %
HCT: 44.6 % (ref 38.5–50.0)
Hemoglobin: 15 g/dL (ref 13.2–17.1)
Lymphs Abs: 1985 cells/uL (ref 850–3900)
MCH: 30.3 pg (ref 27.0–33.0)
MCHC: 33.6 g/dL (ref 32.0–36.0)
MCV: 90.1 fL (ref 80.0–100.0)
MPV: 10.1 fL (ref 7.5–12.5)
Monocytes Relative: 8.3 %
Neutro Abs: 3673 cells/uL (ref 1500–7800)
Neutrophils Relative %: 58.3 %
Platelets: 240 10*3/uL (ref 140–400)
RBC: 4.95 10*6/uL (ref 4.20–5.80)
RDW: 12.3 % (ref 11.0–15.0)
Total Lymphocyte: 31.5 %
WBC: 6.3 10*3/uL (ref 3.8–10.8)

## 2020-07-09 LAB — LIPID PANEL
Cholesterol: 177 mg/dL (ref <200)
HDL: 40 mg/dL (ref >=40)
LDL Cholesterol (Calc): 113 mg/dL (calc) — ABNORMAL HIGH
Non-HDL Cholesterol (Calc): 137 mg/dL (calc) — ABNORMAL HIGH (ref <130)
Total CHOL/HDL Ratio: 4.4 (calc) (ref <5.0)
Triglycerides: 127 mg/dL (ref <150)

## 2020-07-09 LAB — T4, FREE: Free T4: 1.3 ng/dL (ref 0.8–1.8)

## 2020-07-09 LAB — TSH: TSH: 3.11 mIU/L (ref 0.40–4.50)

## 2020-07-09 LAB — T3, FREE: T3, Free: 3.6 pg/mL (ref 2.3–4.2)

## 2020-07-18 MED FILL — ADDERALL XR 30 MG CAP SA: 30 | 90 days supply | Qty: 180 | Fill #0

## 2020-07-20 ENCOUNTER — Ambulatory Visit (INDEPENDENT_AMBULATORY_CARE_PROVIDER_SITE_OTHER): Payer: No Typology Code available for payment source | Admitting: Orthopedic Surgery

## 2020-07-20 ENCOUNTER — Encounter: Payer: Self-pay | Admitting: Orthopedic Surgery

## 2020-07-20 ENCOUNTER — Ambulatory Visit: Payer: Self-pay

## 2020-07-20 DIAGNOSIS — M25512 Pain in left shoulder: Secondary | ICD-10-CM

## 2020-07-20 DIAGNOSIS — M25511 Pain in right shoulder: Secondary | ICD-10-CM

## 2020-07-20 DIAGNOSIS — M542 Cervicalgia: Secondary | ICD-10-CM

## 2020-07-25 ENCOUNTER — Telehealth: Payer: Self-pay | Admitting: Orthopedic Surgery

## 2020-07-25 NOTE — Progress Notes (Signed)
Office Visit Note   Patient: Patrick Brock           Date of Birth: 23-Sep-1976           MRN: [id number] Visit Date: 07/20/2020 Requested by: Laurey Morale, MD [place],  [place] [id number] PCP: Laurey Morale, MD  Subjective: Chief Complaint  Patient presents with  . Left Shoulder - Pain  . Neck - Pain    HPI: Patrick Brock is a 44 y.o. male who presents to the office complaining of left shoulder pain.  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  Since then he notes significant left shoulder pain that wakes him up at night with pain at times.  He feels subjectively weak with the arm compared with the contralateral arm.  He notes significant pain with holding his left arm away from his body.  Mowing the lawn is difficult.  He feels like the shoulder subluxes at times.  He denies any previous history of left shoulder instability, surgery, injury.  He is currently working in a desk job.  Pain occasionally radiates to the elbow.  He has been seen by therapist and notes that dry needling helps but does not significantly improve the pain..                ROS: All systems reviewed are negative as they relate to the chief complaint within the history of present illness.  Patient denies fevers or chills.  Assessment & Plan: Visit Diagnoses:  1. Bilateral shoulder pain, unspecified chronicity   2. Neck pain     Plan: Patient is a 44 year old male who presents complaint of left shoulder pain.  He has had 3 months of left shoulder pain following an injury where he was lifting something heavy and awkwardly caught it with his left arm.  He has pain moving his arm away from his body and pain that wakes him up at night.  He feels like it is weak subjectively and feels like it subluxes at times.  He does have some anterior laxity  of the glenohumeral joint on exam as well as decreased range of motion.  Plan to order MRI arthrogram of the left shoulder for further evaluation.  Follow-up after MRI to review results.  Patient agreed with plan.  Follow-Up Instructions: No follow-ups on file.   Orders:  Orders Placed This Encounter  Procedures  . XR Shoulder Left  . XR Shoulder Right  . XR Cervical Spine 2 or 3 views  . MR Shoulder Left w/ contrast  . Arthrogram   No orders of the defined types were placed in this encounter.     Procedures: No procedures performed   Clinical Data: No additional findings.  Objective: Vital Signs: There were no vitals taken for this visit.  Physical Exam:  Constitutional: Patient appears well-developed HEENT:  Head: Normocephalic Eyes:EOM are normal Neck: Normal range of motion Cardiovascular: Normal rate Pulmonary/chest: Effort normal Neurologic: Patient is alert Skin: Skin is warm Psychiatric: Patient has normal mood and affect  Ortho Exam: Ortho exam demonstrates left shoulder with 50 degrees external rotation, 75 degrees abduction, 90 degrees forward flexion.  Range of motion is decreased compared to the contralateral side.  No significant weakness with supraspinatus, infraspinatus, subscapularis but he does have pain with resisted infraspinatus testing.  Mild laxity of the glenohumeral joint anteriorly.  No significant laxity posteriorly.  No tenderness over the [facility] joint or the bicipital groove.  No impingement signs.  No pain with cervical spine range of motion.  No tenderness of the axial cervical spine or paraspinal musculature.  5/5 motor strength of the bilateral grip strength, finger abduction, pronation/supination, bicep, tricep, deltoid.  Specialty Comments:  No specialty comments available.  Imaging: No results found.   PMFS History: Patient Active Problem List   Diagnosis Date Noted  . Hypothyroidism 12/05/2018  . Acute pain in scrotum 05/04/2017  .  Left inguinal pain 05/04/2017  . 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 GERD (gastroesophageal reflux disease) 06/05/2011  . Musculoskeletal pain 04/12/2011  . VIRAL URI 09/13/2010  . COSTOCHONDRITIS 09/13/2010  . HYPOGONADISM 07/14/2010  . GLOSSITIS 07/14/2010  . MYALGIA 05/15/2010  . WEAKNESS 05/15/2010  . ADHD 06/28/2009  . HEMATURIA UNSPECIFIED 06/28/2009  . ANXIETY 08/08/2007  . LOW BACK PAIN 08/08/2007   Past Medical History:  Diagnosis Date  . ADHD (attention deficit hyperactivity disorder)   . Allergy    SEASONAL  . Anxiety states   . Esophageal reflux   . Esophagitis   . Hiatal hernia   . Hypertension   . Hypogonadism male   . Low back pain     Family History  Problem Relation Age of Onset  . Cancer Mother        ovarian  . Heart disease Father   . Colon cancer Neg Hx     History reviewed. No pertinent surgical history. Social History   Occupational History  . Occupation: Systems analyst: [employer]  Tobacco Use  . Smoking status: Never Smoker  . Smokeless tobacco: Never Used  Substance and Sexual Activity  . Alcohol use: No    Alcohol/week: 0.0 standard drinks  . Drug use: No  . Sexual activity: Not on file

## 2020-07-25 NOTE — Telephone Encounter (Signed)
Left a message for pt to call and set MRI result appt. Will try to call later

## 2020-07-25 NOTE — Telephone Encounter (Signed)
Called and left 2nd VM fot MRI results appt. Will try back later

## 2020-08-11 ENCOUNTER — Other Ambulatory Visit: Payer: Self-pay

## 2020-08-11 ENCOUNTER — Ambulatory Visit
Admission: RE | Admit: 2020-08-11 | Discharge: 2020-08-11 | Disposition: A | Discharge status: Home / Self Care | Payer: No Typology Code available for payment source | Source: Ambulatory Visit | Attending: Orthopedic Surgery | Admitting: Orthopedic Surgery

## 2020-08-11 DIAGNOSIS — M25511 Pain in right shoulder: Secondary | ICD-10-CM

## 2020-08-11 DIAGNOSIS — M25512 Pain in left shoulder: Secondary | ICD-10-CM

## 2020-08-11 MED ORDER — IOPAMIDOL (ISOVUE-M 200) INJECTION 41%
15.0000 mL | Freq: Once | INTRAMUSCULAR | Status: AC
  Administered 2020-08-11: 15 mL via INTRA_ARTICULAR

## 2020-08-15 ENCOUNTER — Ambulatory Visit (INDEPENDENT_AMBULATORY_CARE_PROVIDER_SITE_OTHER): Payer: No Typology Code available for payment source | Admitting: Orthopedic Surgery

## 2020-08-15 DIAGNOSIS — M25511 Pain in right shoulder: Secondary | ICD-10-CM

## 2020-08-15 DIAGNOSIS — M25512 Pain in left shoulder: Secondary | ICD-10-CM | POA: Diagnosis not present

## 2020-08-17 ENCOUNTER — Encounter: Payer: Self-pay | Admitting: Orthopedic Surgery

## 2020-08-17 NOTE — Progress Notes (Signed)
Office Visit Note   Patient: Patrick Brock           Date of Birth: December 31, 1975           MRN: [id number] Visit Date: 08/15/2020 Requested by: Laurey Morale, MD [place],  [place] [id number] PCP: Laurey Morale, MD  Subjective: Chief Complaint  Patient presents with  . scan review    HPI: [name] is a 44 year old patient with left shoulder pain.  States that the pain wakes him from sleep at night about 3-4 times a week.  Hurts for him to shift in bed.  He feels like at times the shoulder is "out of socket".  Uses a pillow to wedge his arm in place.  His injury was several months ago where he was trying to catch a piece of furniture and it stretched and extended his left arm.  He denies any frank instability or dislocation of the shoulder itself but does report that something is "moving around in there".  States that his symptoms are getting worse.  Localizes pain to the anterolateral aspect of the shoulder.  He has had an MRI scan since he was last seen.  That scan is reviewed with the patient.  In general it shows partial-thickness rotator cuff tear of the anterior aspect of the supraspinatus without retraction and without a full-thickness component.  Some bursitis is present.  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  Subscap and rotator cuff otherwise appear intact.  Superior labrum also appears intact.              ROS: All systems reviewed are negative as they relate to the chief complaint within the history of present illness.  Patient denies  fevers or chills.   Assessment & Plan: Visit Diagnoses:  1. Bilateral shoulder pain, unspecified chronicity     Plan: Impression is left shoulder pain unclear etiology.  Some of these mechanical symptoms may be biceps subluxation.  Difficult to say for sure.  Looked at the axial cuts on the scan and proximally the subscap looks intact but it does look a little bit murky.  Anterior inferior posterior inferior glenohumeral ligaments  intact.  Superior labrum looks fairly reasonable.  AC joint does have some arthritis but he is not particularly tender clinically in this region.  Has mildly increased tenderness but I will think that is really giving him the mechanical symptoms he is describing and that is his primary concern today.  Looked at the shoulder with ultrasound as well with internal extra rotation just to try to see if I could see that biceps tendon subluxate over but cannot really do it.  He does report when his arm is abduction it and internally rotated he has some of those symptoms.  Plan at this time is ultrasound-guided injection of the glenohumeral joint in case he has some inflammation around that biceps tendon sheath.  Follow-up with me in about 6 weeks.  I think [name] wants a definitive answer as to exactly what is going on in his shoulder and that is really not possible with all available data at this time.  I do think however that biceps subluxation is a possibility based on primarily his history and absence of pathology of the labrum around the anterior inferior and posterior inferior glenoid.  Follow-Up Instructions: Return in about 6 weeks (around 09/26/2020).   Orders:  No orders of the defined types were placed in this encounter.  No orders of the  defined types were placed in this encounter.     Procedures: No procedures performed   Clinical Data: No additional findings.  Objective: Vital Signs: There were no vitals taken for this visit.  Physical Exam:   Constitutional: Patient appears well-developed HEENT:  Head: Normocephalic Eyes:EOM are normal Neck: Normal range of motion Cardiovascular: Normal rate Pulmonary/chest: Effort normal Neurologic: Patient is alert Skin: Skin is warm Psychiatric: Patient has normal mood and affect    Ortho Exam: Ortho exam demonstrates full active and passive range of motion with forward flexion in both shoulders.  Abduction is little bit more tender for  [name] on the left than the right.  Rotator cuff strength is excellent demonstrate supraspinatus subscap muscle testing.  Negative O'Brien's testing bilaterally.  Mildly increased AC joint tenderness on the left compared to the right and some pain with crossarm adduction.  Localizes some tenderness to the anterolateral aspect of the shoulder around the acromion.  The acromion itself is nontender.  No other masses lymphadenopathy or skin changes noted in that shoulder girdle region.  Neck range of motion is full.  Motor sensory function to the arm is intact with no numbness and tingling.  Specialty Comments:  No specialty comments available.  Imaging: No results found.   PMFS History: Patient Active Problem List   Diagnosis Date Noted  . Hypothyroidism 12/05/2018  . Acute pain in scrotum 05/04/2017  . Left inguinal pain 05/04/2017  . 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 GERD (gastroesophageal reflux disease) 06/05/2011  . Musculoskeletal pain 04/12/2011  . VIRAL URI 09/13/2010  . COSTOCHONDRITIS 09/13/2010  . HYPOGONADISM 07/14/2010  . GLOSSITIS 07/14/2010  . MYALGIA 05/15/2010  . WEAKNESS 05/15/2010  . ADHD 06/28/2009  . HEMATURIA UNSPECIFIED 06/28/2009  . ANXIETY 08/08/2007  . LOW BACK PAIN 08/08/2007   Past Medical History:  Diagnosis Date  . ADHD (attention deficit hyperactivity disorder)   . Allergy    SEASONAL  . Anxiety states   . Esophageal reflux   . Esophagitis   . Hiatal hernia   . Hypertension   . Hypogonadism male   . Low back pain     Family History  Problem Relation Age of Onset  . Cancer Mother        ovarian  . Heart disease Father   . Colon cancer Neg Hx     History reviewed. No pertinent surgical history. Social History   Occupational History  . Occupation: Systems analyst: [employer]  Tobacco Use  . Smoking status: Never Smoker  . Smokeless tobacco: Never Used  Substance and Sexual Activity  . Alcohol use: No    Alcohol/week:  0.0 standard drinks  . Drug use: No  . Sexual activity: Not on file

## 2020-08-26 ENCOUNTER — Other Ambulatory Visit: Payer: Self-pay | Admitting: Family Medicine

## 2020-08-26 ENCOUNTER — Ambulatory Visit: Payer: Self-pay

## 2020-08-26 ENCOUNTER — Other Ambulatory Visit: Payer: Self-pay

## 2020-08-26 ENCOUNTER — Encounter: Payer: Self-pay | Admitting: Family Medicine

## 2020-08-26 ENCOUNTER — Ambulatory Visit (INDEPENDENT_AMBULATORY_CARE_PROVIDER_SITE_OTHER): Payer: No Typology Code available for payment source | Admitting: Family Medicine

## 2020-08-26 DIAGNOSIS — M25512 Pain in left shoulder: Secondary | ICD-10-CM | POA: Diagnosis not present

## 2020-08-26 MED ORDER — HYDROCODONE-ACETAMINOPHEN 5-325 MG PO TABS: 1.0000 | ORAL_TABLET | Freq: Four times a day (QID) | ORAL | 0 refills | Status: DC | PRN

## 2020-08-26 MED FILL — HYDROCODON-APAP 5-325: 5-325 | 3 days supply | Qty: 12 | Fill #0

## 2020-08-26 NOTE — Progress Notes (Signed)
Subjective: Patient is here for ultrasound-guided intra-articular left glenohumeral injection.  He has anterior pain with certain movements, and stiffness in the shoulder.  Objective: He is tender directly over the long head biceps tendon but he also has adhesive capsulitis with abduction limited to about 85 degrees and external rotation limited to about 45 degrees compared to more than 90 on the right.  Procedure: Ultrasound-guided left glenohumeral injection: After sterile prep with Betadine, injected 8 cc 1% lidocaine without epinephrine and 40 mg methylprednisolone using a 22-gauge spinal needle, passing the needle from posterior approach into the glenohumeral joint.  Injectate seen filling the joint capsule.  He will follow-up with Dr. Marlou Sa as directed.

## 2020-08-30 ENCOUNTER — Other Ambulatory Visit: Payer: Self-pay | Admitting: Family Medicine

## 2020-08-30 ENCOUNTER — Other Ambulatory Visit: Payer: Self-pay

## 2020-08-30 ENCOUNTER — Emergency Department (INDEPENDENT_AMBULATORY_CARE_PROVIDER_SITE_OTHER)
Admission: EM | Admit: 2020-08-30 | Discharge: 2020-08-30 | Disposition: A | Discharge status: Home / Self Care | Payer: No Typology Code available for payment source | Source: Home / Self Care | Attending: Family Medicine | Admitting: Family Medicine

## 2020-08-30 DIAGNOSIS — J029 Acute pharyngitis, unspecified: Secondary | ICD-10-CM | POA: Diagnosis not present

## 2020-08-30 DIAGNOSIS — K120 Recurrent oral aphthae: Secondary | ICD-10-CM | POA: Diagnosis not present

## 2020-08-30 LAB — POCT RAPID STREP A (OFFICE): Rapid Strep A Screen: NEGATIVE

## 2020-08-30 MED ORDER — TRIAMCINOLONE ACETONIDE 0.1 % MT PSTE: PASTE | OROMUCOSAL | 0 refills | Status: DC

## 2020-08-30 MED FILL — TRIAMCINOLONE ACETONIDE 0.1: 0.1 | 10 days supply | Qty: 5 | Fill #0

## 2020-08-30 NOTE — ED Provider Notes (Signed)
[name]    CSN: [id number] Arrival date & time: 08/30/20  1517      History   Chief Complaint Chief Complaint  Patient presents with  . Sore Throat    HPI Patrick Brock is a 44 y.o. male.   Patient developed sore throat and neck soreness three days ago.  Yesterday he developed bilateral earache and soreness in the right lateral edge of his tongue.  He has been fatigued but denies nasal congestion, fever, and cough.  No GI or GU symptoms.  The history is provided by the patient.    Past Medical History:  Diagnosis Date  . ADHD (attention deficit hyperactivity disorder)   . Allergy    SEASONAL  . Anxiety states   . Esophageal reflux   . Esophagitis   . Hiatal hernia   . Hypertension   . Hypogonadism male   . Low back pain     Patient Active Problem List   Diagnosis Date Noted  . Hypothyroidism 12/05/2018  . Acute pain in scrotum 05/04/2017  . Left inguinal pain 05/04/2017  . 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 GERD (gastroesophageal reflux disease) 06/05/2011  . Musculoskeletal pain 04/12/2011  . VIRAL URI 09/13/2010  . COSTOCHONDRITIS 09/13/2010  . HYPOGONADISM 07/14/2010  . GLOSSITIS 07/14/2010  . MYALGIA 05/15/2010  . WEAKNESS 05/15/2010  . ADHD 06/28/2009  . HEMATURIA UNSPECIFIED 06/28/2009  . ANXIETY 08/08/2007  . LOW BACK PAIN 08/08/2007    History reviewed. No pertinent surgical history.     Home Medications    Prior to Admission medications   Medication Sig Start Date End Date Taking? Authorizing Provider  amphetamine-dextroamphetamine (ADDERALL XR) 30 MG 24 hr capsule Take 1 capsule (30 mg total) by mouth in the morning and at bedtime. 07/08/20 10/06/20  Laurey Morale, MD  HYDROcodone-acetaminophen (NORCO/VICODIN) 5-325 MG tablet Take 1 tablet by mouth every 6 (six) hours as needed for moderate pain. 08/26/20   Hilts, Legrand Como, MD  levothyroxine (SYNTHROID) 50 MCG tablet TAKE 1 TABLET BY MOUTH DAILY. 03/10/20   Laurey Morale, MD  triamcinolone (KENALOG) 0.1 % paste Place thin layer on mouth ulcers four times daily 08/30/20   Kandra Nicolas, MD    Family History Family History  Problem Relation Age of Onset  . Cancer Mother        ovarian  . Heart disease Father   . Colon cancer Neg Hx     Social History Social History   Tobacco Use  . Smoking status: Never Smoker  . Smokeless tobacco: Never Used  Vaping Use  . Vaping Use: Never used  Substance Use Topics  . Alcohol use: No    Alcohol/week: 0.0 standard drinks  . Drug use: No     Allergies   Erythromycin   Review of Systems Review of Systems + sore throat + neck soreness No cough No pleuritic pain No wheezing No nasal congestion ? post-nasal drainage No sinus pain/pressure No itchy/red eyes + earache No hemoptysis No SOB No fever/chills No nausea No vomiting No abdominal pain No diarrhea No urinary symptoms No skin rash + fatigue No myalgias No headache Used OTC meds (Tylenol) without relief   Physical Exam Triage Vital Signs ED Triage Vitals  Enc Vitals Group     BP 08/30/20 1535 (!) 160/90     Pulse Rate 08/30/20 1535 97     Resp 08/30/20 1535 17     Temp 08/30/20 1540 99.1 F (37.3 C)  Temp Source 08/30/20 1535 Oral     SpO2 08/30/20 1535 97 %     Weight --      Height --      Head Circumference --      Peak Flow --      Pain Score 08/30/20 1538 9     Pain Loc --      Pain Edu? --      Excl. in [place]? --    No data found.  Updated Vital Signs BP (!) 160/90 (BP Location: Left Arm)   Pulse 97   Temp 99.1 F (37.3 C) (Oral)   Resp 17   SpO2 97%   Visual Acuity Right Eye Distance:   Left Eye Distance:   Bilateral Distance:    Right Eye Near:   Left Eye Near:    Bilateral Near:     Physical Exam Nursing notes and Vital Signs reviewed. Appearance:  Patient appears stated age, and in no acute distress Eyes:  Pupils are equal, round, and reactive to light and accomodation.  Extraocular  movement is intact.  Conjunctivae are not inflamed  Ears:  Canals normal.  Tympanic membranes normal.  Nose:  Mildly congested turbinates.  No sinus tenderness. Mouth:  Aphthous ulcer on right lateral edge of tongue. Pharynx:  Mild erythema. Neck:  Supple.  Mildly enlarged lateral nodes are present, tender to palpation on the left.   Lungs:  Clear to auscultation.  Breath sounds are equal.  Moving air well. Heart:  Regular rate and rhythm without murmurs, rubs, or gallops.  Abdomen:  Nontender without masses or hepatosplenomegaly.  Bowel sounds are present.  No CVA or flank tenderness.  Extremities:  No edema.  Skin:  No rash present.   UC Treatments / Results  Labs (all labs ordered are listed, but only abnormal results are displayed) Labs Reviewed  CULTURE, GROUP A STREP ([place])  NOVEL CORONAVIRUS, NAA  POCT RAPID STREP A (OFFICE) negative  Tympanometry:  Right ear tympanogram positive peak pressure; Left ear tympanogram normal  EKG   Radiology No results found.  Procedures Procedures (including critical care time)  Medications Ordered in UC Medications - No data to display  Initial Impression / Assessment and Plan / UC Course  I have reviewed the triage vital signs and the nursing notes.  Pertinent labs & imaging results that were available during my care of the patient were reviewed by me and considered in my medical decision making (see chart for details).     There is no evidence of bacterial infection today.  Suspect early viral URI.  Treat symptomatically for now. Rx for Kenalog in [place]. COVID PCR and Strep A DNA probe pending.  Followup with Family Doctor if not improved in about 10 days.  Final Clinical Impressions(s) / UC Diagnoses   Final diagnoses:  Acute pharyngitis, unspecified etiology  Oral aphthous ulcer     Discharge Instructions     If cold-like symptoms develop, try the following: Take plain guaifenesin (1200mg  extended release tabs such  as Mucinex) twice daily, with plenty of water, for cough and congestion. Get adequate rest.   For sinus congestion, may use Afrin nasal spray (or generic oxymetazoline) each morning for about 5 days and then discontinue.  Also recommend using saline nasal spray several times daily and saline nasal irrigation (AYR is a common brand).  Use Flonase nasal spray each morning after using Afrin nasal spray and saline nasal irrigation. Try warm salt water gargles for sore throat.  Stop all antihistamines for now, and other non-prescription cough/cold preparations. May take Ibuprofen 200mg , 4 tabs every 8 hours with food for sore throat, fever, etc. May take Delsym Cough Suppressant ("12 Hour Cough Relief") at bedtime for nighttime cough.    Isolate yourself until COVID-19 test result is available.   If your COVID19 test is positive, then you are infected with the novel coronavirus and could give the virus to others.  Please continue isolation at home for at least 10 days since the start of your symptoms.   Once you complete your 10 day quarantine, you may return to normal activities as long as you've not had a fever for over 24 hours (without taking fever reducing medicine) and your symptoms are improving. Please continue good preventive care measures, including:  frequent hand-washing, avoid touching your face, cover coughs/sneezes, stay out of crowds and keep a 6 foot distance from others.  Go to the nearest hospital emergency room if fever/cough/breathlessness are severe or illness seems like a threat to life.     ED Prescriptions    Medication Sig Dispense Auth. Provider   triamcinolone (KENALOG) 0.1 % paste Place thin layer on mouth ulcers four times daily 5 g Kandra Nicolas, MD        Kandra Nicolas, MD 09/02/20 [phone]

## 2020-08-30 NOTE — ED Triage Notes (Addendum)
Pt c/o sore throat x 3 days. Ear pain and ringing, swollen lymph nodes. Hx of strep and seasonal allergies. Tylenol prn. No known covid exposure. Has not had covid vaccinations.

## 2020-08-30 NOTE — Discharge Instructions (Addendum)
If cold-like symptoms develop, try the following: Take plain guaifenesin (1200mg  extended release tabs such as Mucinex) twice daily, with plenty of water, for cough and congestion. Get adequate rest.   For sinus congestion, may use Afrin nasal spray (or generic oxymetazoline) each morning for about 5 days and then discontinue.  Also recommend using saline nasal spray several times daily and saline nasal irrigation (AYR is a common brand).  Use Flonase nasal spray each morning after using Afrin nasal spray and saline nasal irrigation. Try warm salt water gargles for sore throat.  Stop all antihistamines for now, and other non-prescription cough/cold preparations. May take Ibuprofen 200mg , 4 tabs every 8 hours with food for sore throat, fever, etc. May take Delsym Cough Suppressant ("12 Hour Cough Relief") at bedtime for nighttime cough.    Isolate yourself until COVID-19 test result is available.   If your COVID19 test is positive, then you are infected with the novel coronavirus and could give the virus to others.  Please continue isolation at home for at least 10 days since the start of your symptoms.   Once you complete your 10 day quarantine, you may return to normal activities as long as you've not had a fever for over 24 hours (without taking fever reducing medicine) and your symptoms are improving. Please continue good preventive care measures, including:  frequent hand-washing, avoid touching your face, cover coughs/sneezes, stay out of crowds and keep a 6 foot distance from others.  Go to the nearest hospital emergency room if fever/cough/breathlessness are severe or illness seems like a threat to life.

## 2020-08-31 LAB — SARS-COV-2, NAA 2 DAY TAT

## 2020-08-31 LAB — NOVEL CORONAVIRUS, NAA: SARS-CoV-2, NAA: NOT DETECTED

## 2020-09-01 LAB — CULTURE, GROUP A STREP
MICRO NUMBER:: 11123161
SPECIMEN QUALITY:: ADEQUATE

## 2020-09-12 MED FILL — LEVOTHYROXINE SODIUM 50 MCG: 50 | 90 days supply | Qty: 90 | Fill #2

## 2020-09-27 MED FILL — TRIAMCINOLONE ACETONIDE 0.1: 0.1 | 10 days supply | Qty: 5 | Fill #0

## 2020-11-28 ENCOUNTER — Telehealth: Payer: Self-pay | Admitting: Family Medicine

## 2020-11-28 ENCOUNTER — Other Ambulatory Visit: Payer: Self-pay | Admitting: Family Medicine

## 2020-11-28 NOTE — Telephone Encounter (Signed)
Pt would like a refill :amphetamine-dextroamphetamine (ADDERALL XR) 30 MG 24 hr capsule  Send to: [place], [place] - [address]  [place], [place] [address]  Phone:  [phone] Fax:  [phone]

## 2020-11-28 NOTE — Telephone Encounter (Signed)
Last office visit- 07/08/2020 Last refill--07/08/2020----180 tabs no refills

## 2020-11-29 ENCOUNTER — Other Ambulatory Visit: Payer: Self-pay | Admitting: Family Medicine

## 2020-11-29 MED FILL — ADDERALL XR 30 MG CAP SA: 30 | 90 days supply | Qty: 180 | Fill #0

## 2020-11-29 NOTE — Telephone Encounter (Signed)
Please advise 

## 2020-11-29 NOTE — Telephone Encounter (Signed)
Done

## 2020-11-30 ENCOUNTER — Other Ambulatory Visit: Payer: Self-pay | Admitting: Family Medicine

## 2020-11-30 MED ORDER — AMPHETAMINE-DEXTROAMPHET ER 30 MG PO CP24
30.0000 mg | ORAL_CAPSULE | Freq: Two times a day (BID) | ORAL | 0 refills | Status: DC
Start: 2020-11-30 — End: 2020-11-30

## 2020-11-30 NOTE — Telephone Encounter (Signed)
Done

## 2020-11-30 NOTE — Telephone Encounter (Signed)
Called and left VM that prescription was sent.

## 2020-12-12 ENCOUNTER — Other Ambulatory Visit: Payer: Self-pay | Admitting: Family Medicine

## 2020-12-12 MED FILL — LEVOTHYROXINE SODIUM 50 MCG: 50 | 90 days supply | Qty: 90 | Fill #0

## 2021-03-03 ENCOUNTER — Other Ambulatory Visit (HOSPITAL_BASED_OUTPATIENT_CLINIC_OR_DEPARTMENT_OTHER): Payer: Self-pay

## 2021-03-03 MED FILL — Amphetamine-Dextroamphetamine Cap ER 24HR 30 MG: ORAL | 90 days supply | Qty: 180 | Fill #0 | Status: AC

## 2021-03-17 ENCOUNTER — Other Ambulatory Visit: Payer: Self-pay | Admitting: Family Medicine

## 2021-03-17 ENCOUNTER — Other Ambulatory Visit (HOSPITAL_BASED_OUTPATIENT_CLINIC_OR_DEPARTMENT_OTHER): Payer: Self-pay

## 2021-03-17 MED ORDER — LEVOTHYROXINE SODIUM 50 MCG PO TABS
ORAL_TABLET | Freq: Every day | ORAL | 0 refills | Status: DC
  Filled 2021-03-17: qty 90, 90d supply, fill #0

## 2021-06-07 ENCOUNTER — Other Ambulatory Visit: Payer: Self-pay | Admitting: Family Medicine

## 2021-06-07 ENCOUNTER — Other Ambulatory Visit (HOSPITAL_BASED_OUTPATIENT_CLINIC_OR_DEPARTMENT_OTHER): Payer: Self-pay

## 2021-06-09 ENCOUNTER — Other Ambulatory Visit (HOSPITAL_BASED_OUTPATIENT_CLINIC_OR_DEPARTMENT_OTHER): Payer: Self-pay

## 2021-06-09 MED ORDER — AMPHETAMINE-DEXTROAMPHET ER 30 MG PO CP24
30.0000 mg | ORAL_CAPSULE | Freq: Two times a day (BID) | ORAL | 0 refills | Status: DC
  Filled 2021-06-09: qty 180, 90d supply, fill #0

## 2021-06-13 ENCOUNTER — Other Ambulatory Visit: Payer: Self-pay | Admitting: Family Medicine

## 2021-06-13 ENCOUNTER — Other Ambulatory Visit (HOSPITAL_BASED_OUTPATIENT_CLINIC_OR_DEPARTMENT_OTHER): Payer: Self-pay

## 2021-06-13 MED ORDER — LEVOTHYROXINE SODIUM 50 MCG PO TABS
ORAL_TABLET | Freq: Every day | ORAL | 0 refills | Status: DC
  Filled 2021-06-13: qty 90, 90d supply, fill #0

## 2021-07-11 ENCOUNTER — Encounter: Payer: Self-pay | Admitting: Family Medicine

## 2021-07-11 ENCOUNTER — Ambulatory Visit (INDEPENDENT_AMBULATORY_CARE_PROVIDER_SITE_OTHER): Payer: No Typology Code available for payment source | Admitting: Family Medicine

## 2021-07-11 ENCOUNTER — Other Ambulatory Visit: Payer: Self-pay

## 2021-07-11 VITALS — BP 128/86 | HR 70 | Temp 97.6°F | Ht 66.25 in | Wt 196.0 lb

## 2021-07-11 DIAGNOSIS — M8949 Other hypertrophic osteoarthropathy, multiple sites: Secondary | ICD-10-CM | POA: Diagnosis not present

## 2021-07-11 DIAGNOSIS — M159 Polyosteoarthritis, unspecified: Secondary | ICD-10-CM

## 2021-07-11 DIAGNOSIS — Z0000 Encounter for general adult medical examination without abnormal findings: Secondary | ICD-10-CM

## 2021-07-11 DIAGNOSIS — E039 Hypothyroidism, unspecified: Secondary | ICD-10-CM | POA: Diagnosis not present

## 2021-07-11 DIAGNOSIS — M150 Primary generalized (osteo)arthritis: Secondary | ICD-10-CM

## 2021-07-11 DIAGNOSIS — M1990 Unspecified osteoarthritis, unspecified site: Secondary | ICD-10-CM | POA: Insufficient documentation

## 2021-07-11 LAB — BASIC METABOLIC PANEL
BUN: 18 mg/dL (ref 6–23)
CO2: 25 mEq/L (ref 19–32)
Calcium: 8.7 mg/dL (ref 8.4–10.5)
Chloride: 104 mEq/L (ref 96–112)
Creatinine, Ser: 1.07 mg/dL (ref 0.40–1.50)
GFR: 82.97 mL/min (ref >60.00)
Glucose, Bld: 91 mg/dL (ref 70–99)
Potassium: 3.8 mEq/L (ref 3.5–5.1)
Sodium: 137 mEq/L (ref 135–145)

## 2021-07-11 LAB — LIPID PANEL
Cholesterol: 182 mg/dL (ref 0–200)
HDL: 44.3 mg/dL (ref >39.00)
LDL Cholesterol: 120 mg/dL — ABNORMAL HIGH (ref 0–99)
NonHDL: 137.96
Total CHOL/HDL Ratio: 4
Triglycerides: 91 mg/dL (ref 0.0–149.0)
VLDL: 18.2 mg/dL (ref 0.0–40.0)

## 2021-07-11 LAB — CBC WITH DIFFERENTIAL/PLATELET
Basophils Absolute: 0.1 10*3/uL (ref 0.0–0.1)
Basophils Relative: 0.8 % (ref 0.0–3.0)
Eosinophils Absolute: 0.1 10*3/uL (ref 0.0–0.7)
Eosinophils Relative: 1.7 % (ref 0.0–5.0)
HCT: 41.8 % (ref 39.0–52.0)
Hemoglobin: 14.1 g/dL (ref 13.0–17.0)
Lymphocytes Relative: 30.6 % (ref 12.0–46.0)
Lymphs Abs: 1.9 10*3/uL (ref 0.7–4.0)
MCHC: 33.7 g/dL (ref 30.0–36.0)
MCV: 90.5 fl (ref 78.0–100.0)
Monocytes Absolute: 0.5 10*3/uL (ref 0.1–1.0)
Monocytes Relative: 7.4 % (ref 3.0–12.0)
Neutro Abs: 3.6 10*3/uL (ref 1.4–7.7)
Neutrophils Relative %: 59.5 % (ref 43.0–77.0)
Platelets: 230 10*3/uL (ref 150.0–400.0)
RBC: 4.62 Mil/uL (ref 4.22–5.81)
RDW: 12.8 % (ref 11.5–15.5)
WBC: 6.1 10*3/uL (ref 4.0–10.5)

## 2021-07-11 LAB — HEPATIC FUNCTION PANEL
ALT: 36 U/L (ref 0–53)
AST: 27 U/L (ref 0–37)
Albumin: 4.2 g/dL (ref 3.5–5.2)
Alkaline Phosphatase: 60 U/L (ref 39–117)
Bilirubin, Direct: 0.2 mg/dL (ref 0.0–0.3)
Total Bilirubin: 1 mg/dL (ref 0.2–1.2)
Total Protein: 6.5 g/dL (ref 6.0–8.3)

## 2021-07-11 LAB — T4, FREE: Free T4: 0.76 ng/dL (ref 0.60–1.60)

## 2021-07-11 LAB — TSH: TSH: 2.83 u[IU]/mL (ref 0.35–5.50)

## 2021-07-11 LAB — T3, FREE: T3, Free: 3.2 pg/mL (ref 2.3–4.2)

## 2021-07-11 LAB — HEMOGLOBIN A1C: Hgb A1c MFr Bld: 5.5 % (ref 4.6–6.5)

## 2021-07-11 NOTE — Progress Notes (Signed)
   Subjective:    Patient ID: Patrick Brock, male    DOB: 1976-02-24, 45 y.o.   MRN: [id number]  HPI Here for a well exam. He is doing well except for some arthritis pain. This involves the hips and knees. He takes Tylenol most days and he gets adequate relief.    Review of Systems  Constitutional: Negative.   HENT: Negative.    Eyes: Negative.   Respiratory: Negative.    Cardiovascular: Negative.   Gastrointestinal: Negative.   Genitourinary: Negative.   Musculoskeletal:  Positive for arthralgias.  Skin: Negative.   Neurological: Negative.   Psychiatric/Behavioral: Negative.        Objective:   Physical Exam Constitutional:      General: He is not in acute distress.    Appearance: Normal appearance. He is well-developed. He is not diaphoretic.  HENT:     Head: Normocephalic and atraumatic.     Right Ear: External ear normal.     Left Ear: External ear normal.     Nose: Nose normal.     Mouth/Throat:     Pharynx: No oropharyngeal exudate.  Eyes:     General: No scleral icterus.       Right eye: No discharge.        Left eye: No discharge.     Conjunctiva/sclera: Conjunctivae normal.     Pupils: Pupils are equal, round, and reactive to light.  Neck:     Thyroid: No thyromegaly.     Vascular: No JVD.     Trachea: No tracheal deviation.  Cardiovascular:     Rate and Rhythm: Normal rate and regular rhythm.     Heart sounds: Normal heart sounds. No murmur heard.   No friction rub. No gallop.  Pulmonary:     Effort: Pulmonary effort is normal. No respiratory distress.     Breath sounds: Normal breath sounds. No wheezing or rales.  Chest:     Chest wall: No tenderness.  Abdominal:     General: Bowel sounds are normal. There is no distension.     Palpations: Abdomen is soft. There is no mass.     Tenderness: There is no abdominal tenderness. There is no guarding or rebound.  Genitourinary:    Penis: Normal. No tenderness.      Testes: Normal.  Musculoskeletal:         General: No tenderness. Normal range of motion.     Cervical back: Neck supple.  Lymphadenopathy:     Cervical: No cervical adenopathy.  Skin:    General: Skin is warm and dry.     Coloration: Skin is not pale.     Findings: No erythema or rash.  Neurological:     Mental Status: He is alert and oriented to person, place, and time.     Cranial Nerves: No cranial nerve deficit.     Motor: No abnormal muscle tone.     Coordination: Coordination normal.     Deep Tendon Reflexes: Reflexes are normal and symmetric. Reflexes normal.  Psychiatric:        Behavior: Behavior normal.        Thought Content: Thought content normal.        Judgment: Judgment normal.          Assessment & Plan:  Well exam. We discussed diet and exercise. Get fasting labs. Alysia Penna, MD

## 2021-09-07 ENCOUNTER — Other Ambulatory Visit: Payer: Self-pay | Admitting: Family Medicine

## 2021-09-07 ENCOUNTER — Other Ambulatory Visit (HOSPITAL_BASED_OUTPATIENT_CLINIC_OR_DEPARTMENT_OTHER): Payer: Self-pay

## 2021-09-07 MED ORDER — AMPHETAMINE-DEXTROAMPHET ER 30 MG PO CP24
30.0000 mg | ORAL_CAPSULE | Freq: Two times a day (BID) | ORAL | 0 refills | Status: DC
  Filled 2021-09-07: qty 60, 30d supply, fill #0

## 2021-09-08 ENCOUNTER — Other Ambulatory Visit (HOSPITAL_BASED_OUTPATIENT_CLINIC_OR_DEPARTMENT_OTHER): Payer: Self-pay

## 2021-09-08 MED ORDER — LEVOTHYROXINE SODIUM 50 MCG PO TABS
ORAL_TABLET | Freq: Every day | ORAL | 0 refills | Status: DC
  Filled 2021-09-08: qty 90, 90d supply, fill #0

## 2021-10-18 ENCOUNTER — Other Ambulatory Visit: Payer: Self-pay | Admitting: Adult Health

## 2021-10-18 ENCOUNTER — Other Ambulatory Visit (HOSPITAL_BASED_OUTPATIENT_CLINIC_OR_DEPARTMENT_OTHER): Payer: Self-pay

## 2021-10-20 ENCOUNTER — Other Ambulatory Visit (HOSPITAL_BASED_OUTPATIENT_CLINIC_OR_DEPARTMENT_OTHER): Payer: Self-pay

## 2021-10-20 MED ORDER — AMPHETAMINE-DEXTROAMPHET ER 30 MG PO CP24
30.0000 mg | ORAL_CAPSULE | Freq: Two times a day (BID) | ORAL | 0 refills | Status: DC
  Filled 2021-10-20: qty 60, 30d supply, fill #0

## 2021-10-20 NOTE — Telephone Encounter (Signed)
Okay for refill?    LOV 07/2021   Last Refill  09/2021-10/11/2021   30 QTY.  0 Refills

## 2021-10-20 NOTE — Telephone Encounter (Signed)
Okay to refill? 

## 2021-11-24 ENCOUNTER — Other Ambulatory Visit: Payer: Self-pay | Admitting: Adult Health

## 2021-11-24 ENCOUNTER — Other Ambulatory Visit (HOSPITAL_BASED_OUTPATIENT_CLINIC_OR_DEPARTMENT_OTHER): Payer: Self-pay

## 2021-11-26 NOTE — Telephone Encounter (Signed)
Last refill per controlled substance database:10/20/21 by Tommi Rumps Last OV: 07/21/21 Next OV: none scheduled

## 2021-11-27 ENCOUNTER — Other Ambulatory Visit (HOSPITAL_BASED_OUTPATIENT_CLINIC_OR_DEPARTMENT_OTHER): Payer: Self-pay

## 2021-11-27 MED ORDER — AMPHETAMINE-DEXTROAMPHET ER 30 MG PO CP24
30.0000 mg | ORAL_CAPSULE | Freq: Two times a day (BID) | ORAL | 0 refills | Status: DC
  Filled 2021-11-27: qty 180, 90d supply, fill #0

## 2021-11-27 NOTE — Telephone Encounter (Signed)
I sent in a 90 day supply instead of 30

## 2022-01-01 ENCOUNTER — Telehealth: Payer: No Typology Code available for payment source | Admitting: Nurse Practitioner

## 2022-01-01 DIAGNOSIS — L03211 Cellulitis of face: Secondary | ICD-10-CM | POA: Diagnosis not present

## 2022-01-01 MED ORDER — SULFAMETHOXAZOLE-TRIMETHOPRIM 800-160 MG PO TABS: 1.0000 | ORAL_TABLET | Freq: Two times a day (BID) | ORAL | 0 refills | Status: AC

## 2022-01-01 NOTE — Progress Notes (Signed)
E Visit for Cellulitis  We are sorry that you are not feeling well. Here is how we plan to help!  Based on what you shared with me it looks like you have cellulitis.  Cellulitis looks like areas of skin redness, swelling, and warmth; it develops as a result of bacteria entering under the skin. Little red spots and/or bleeding can be seen in skin, and tiny surface sacs containing fluid can occur. Fever can be present. Cellulitis is almost always on one side of a body, and the lower limbs are the most common site of involvement.   I have prescribed:  Bactrim DS 1 tablet by mouth twice a day for 7 days  HOME CARE:  Take your medications as ordered and take all of them, even if the skin irritation appears to be healing.   GET HELP RIGHT AWAY IF:  Symptoms that don't begin to go away within 48 hours. Severe redness persists or worsens If the area turns color, spreads or swells. If it blisters and opens, develops yellow-brown crust or bleeds. You develop a fever or chills. If the pain increases or becomes unbearable.  Are unable to keep fluids and food down.  MAKE SURE YOU   Understand these instructions. Will watch your condition. Will get help right away if you are not doing well or get worse.  Thank you for choosing an e-visit.  Your e-visit answers were reviewed by a board certified advanced clinical practitioner to complete your personal care plan. Depending upon the condition, your plan could have included both over the counter or prescription medications.  Please review your pharmacy choice. Make sure the pharmacy is open so you can pick up prescription now. If there is a problem, you may contact your provider through [employer] and have the prescription routed to another pharmacy.  Your safety is important to [place]. If you have drug allergies check your prescription carefully.   For the next 24 hours you can use MyChart to ask questions about today's visit, request a  non-urgent call back, or ask for a work or school excuse. You will get an email in the next two days asking about your experience. I hope that your e-visit has been valuable and will speed your recovery.   I spent approximately 7 minutes reviewing the patient's history, current symptoms and coordinating their plan of care today.    Meds ordered this encounter  Medications   sulfamethoxazole-trimethoprim (BACTRIM DS) 800-160 MG tablet    Sig: Take 1 tablet by mouth 2 (two) times daily for 7 days.    Dispense:  14 tablet    Refill:  0

## 2022-01-02 ENCOUNTER — Other Ambulatory Visit (HOSPITAL_BASED_OUTPATIENT_CLINIC_OR_DEPARTMENT_OTHER): Payer: Self-pay

## 2022-01-02 ENCOUNTER — Other Ambulatory Visit: Payer: Self-pay | Admitting: Family Medicine

## 2022-01-02 MED ORDER — LEVOTHYROXINE SODIUM 50 MCG PO TABS
ORAL_TABLET | Freq: Every day | ORAL | 0 refills | Status: DC
  Filled 2022-01-02: qty 90, 90d supply, fill #0

## 2022-02-28 ENCOUNTER — Other Ambulatory Visit (HOSPITAL_BASED_OUTPATIENT_CLINIC_OR_DEPARTMENT_OTHER): Payer: Self-pay

## 2022-02-28 ENCOUNTER — Other Ambulatory Visit: Payer: Self-pay | Admitting: Family Medicine

## 2022-02-28 NOTE — Telephone Encounter (Signed)
Last OV- 07/11/21 ?Last refill- 11/27/21--180 capsules, 0 refill ? ?No future OV scheduled.   ?

## 2022-03-01 ENCOUNTER — Other Ambulatory Visit (HOSPITAL_COMMUNITY): Payer: Self-pay

## 2022-03-01 MED ORDER — AMPHETAMINE-DEXTROAMPHET ER 30 MG PO CP24
30.0000 mg | ORAL_CAPSULE | Freq: Two times a day (BID) | ORAL | 0 refills | Status: DC
  Filled 2022-03-01: qty 180, 90d supply, fill #0

## 2022-03-01 NOTE — Telephone Encounter (Signed)
Done for 90 days

## 2022-03-05 ENCOUNTER — Other Ambulatory Visit (HOSPITAL_BASED_OUTPATIENT_CLINIC_OR_DEPARTMENT_OTHER): Payer: Self-pay

## 2022-03-06 ENCOUNTER — Telehealth: Payer: Self-pay | Admitting: Family Medicine

## 2022-03-06 ENCOUNTER — Other Ambulatory Visit (HOSPITAL_BASED_OUTPATIENT_CLINIC_OR_DEPARTMENT_OTHER): Payer: Self-pay

## 2022-03-06 MED ORDER — AMPHETAMINE-DEXTROAMPHET ER 30 MG PO CP24
30.0000 mg | ORAL_CAPSULE | Freq: Two times a day (BID) | ORAL | 0 refills | Status: DC
  Filled 2022-03-06 – 2022-03-08 (×2): qty 180, 90d supply, fill #0

## 2022-03-06 NOTE — Telephone Encounter (Signed)
I sent it to the new pharmacy  

## 2022-03-06 NOTE — Telephone Encounter (Signed)
Sent pt a message on MyChart

## 2022-03-06 NOTE — Telephone Encounter (Signed)
Patient requesting Adderall XR '30mg'$  to be sent to [facility].    Pharmacy updated.  ?

## 2022-03-06 NOTE — Telephone Encounter (Signed)
Pt requesting  amphetamine-dextroamphetamine (ADDERALL XR) 30 MG 24 hr capsule be sent to  ?[facility] Phone:  [phone]  ?Fax:  [phone]  ?  ? ?

## 2022-03-06 NOTE — Telephone Encounter (Signed)
This was sent to the wrong pharmacy, pt requested this to go to [facility] ?

## 2022-03-06 NOTE — Telephone Encounter (Signed)
This was done on 03-01-22 ?

## 2022-03-07 ENCOUNTER — Other Ambulatory Visit (HOSPITAL_COMMUNITY): Payer: Self-pay

## 2022-03-08 ENCOUNTER — Other Ambulatory Visit (HOSPITAL_BASED_OUTPATIENT_CLINIC_OR_DEPARTMENT_OTHER): Payer: Self-pay

## 2022-04-09 ENCOUNTER — Other Ambulatory Visit (HOSPITAL_BASED_OUTPATIENT_CLINIC_OR_DEPARTMENT_OTHER): Payer: Self-pay

## 2022-04-09 ENCOUNTER — Other Ambulatory Visit: Payer: Self-pay | Admitting: Family Medicine

## 2022-04-09 MED ORDER — LEVOTHYROXINE SODIUM 50 MCG PO TABS
ORAL_TABLET | Freq: Every day | ORAL | 0 refills | Status: DC
  Filled 2022-04-09: qty 90, 90d supply, fill #0

## 2022-04-27 ENCOUNTER — Other Ambulatory Visit (HOSPITAL_BASED_OUTPATIENT_CLINIC_OR_DEPARTMENT_OTHER): Payer: Self-pay

## 2022-05-14 ENCOUNTER — Other Ambulatory Visit (HOSPITAL_BASED_OUTPATIENT_CLINIC_OR_DEPARTMENT_OTHER): Payer: Self-pay

## 2022-05-30 ENCOUNTER — Other Ambulatory Visit: Payer: Self-pay | Admitting: Family Medicine

## 2022-05-30 ENCOUNTER — Other Ambulatory Visit (HOSPITAL_BASED_OUTPATIENT_CLINIC_OR_DEPARTMENT_OTHER): Payer: Self-pay

## 2022-05-30 MED ORDER — AMPHETAMINE-DEXTROAMPHET ER 30 MG PO CP24
30.0000 mg | ORAL_CAPSULE | Freq: Two times a day (BID) | ORAL | 0 refills | Status: DC
  Filled 2022-06-06 – 2022-06-11 (×2): qty 180, 90d supply, fill #0

## 2022-05-30 NOTE — Telephone Encounter (Signed)
Done

## 2022-05-30 NOTE — Telephone Encounter (Signed)
Last OV- 07/11/2021 Last refill- 03/06/2022--180 capsules, 1 tab po BID,  0 refills  No future OV scheduled.

## 2022-06-06 ENCOUNTER — Other Ambulatory Visit (HOSPITAL_BASED_OUTPATIENT_CLINIC_OR_DEPARTMENT_OTHER): Payer: Self-pay

## 2022-06-08 ENCOUNTER — Other Ambulatory Visit (HOSPITAL_BASED_OUTPATIENT_CLINIC_OR_DEPARTMENT_OTHER): Payer: Self-pay

## 2022-06-11 ENCOUNTER — Other Ambulatory Visit (HOSPITAL_BASED_OUTPATIENT_CLINIC_OR_DEPARTMENT_OTHER): Payer: Self-pay

## 2022-07-11 ENCOUNTER — Other Ambulatory Visit (HOSPITAL_BASED_OUTPATIENT_CLINIC_OR_DEPARTMENT_OTHER): Payer: Self-pay

## 2022-07-11 ENCOUNTER — Other Ambulatory Visit: Payer: Self-pay | Admitting: Family Medicine

## 2022-07-11 MED ORDER — LEVOTHYROXINE SODIUM 50 MCG PO TABS
ORAL_TABLET | Freq: Every day | ORAL | 0 refills | Status: DC
  Filled 2022-07-11: qty 90, 90d supply, fill #0

## 2022-07-11 NOTE — Telephone Encounter (Signed)
Pt needs a physical appointment for further refills

## 2022-07-12 ENCOUNTER — Other Ambulatory Visit (HOSPITAL_BASED_OUTPATIENT_CLINIC_OR_DEPARTMENT_OTHER): Payer: Self-pay

## 2022-07-13 ENCOUNTER — Other Ambulatory Visit (HOSPITAL_BASED_OUTPATIENT_CLINIC_OR_DEPARTMENT_OTHER): Payer: Self-pay

## 2022-07-16 ENCOUNTER — Other Ambulatory Visit (HOSPITAL_BASED_OUTPATIENT_CLINIC_OR_DEPARTMENT_OTHER): Payer: Self-pay

## 2022-08-02 IMAGING — MR MR SHOULDER*L* W/ CM
6 series · 36 of 40 positions shown · IV contrast (agent unspecified)
Comparison: Plain films left shoulder 07/20/2020 and 11/13/2016.

CLINICAL DATA: Left shoulder pain for 4 months since an injury
moving furniture.

EXAM:
MR ARTHROGRAM OF THE LEFT SHOULDER
TECHNIQUE: Multiplanar, multisequence MR imaging of the left shoulder was
performed following the administration of intra-articular contrast.
CONTRAST:  See Injection Documentation.

[Series 3: T1 fat-sat · axial · 4.0mm · 0.27mm/px · z∈[+6,+90]mm · 8 of 18 slices shown (1 of 4)]
[im 1/18]
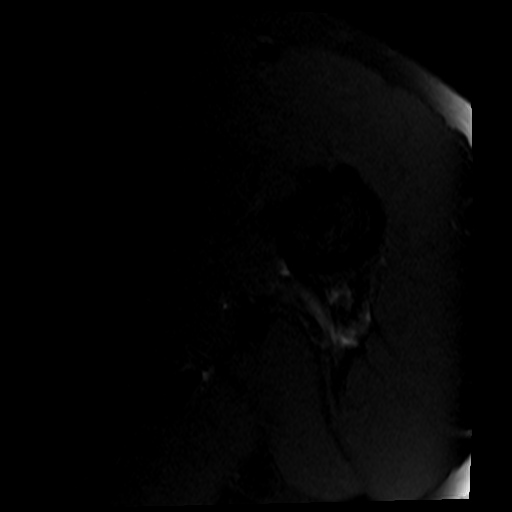
[im 3/18]
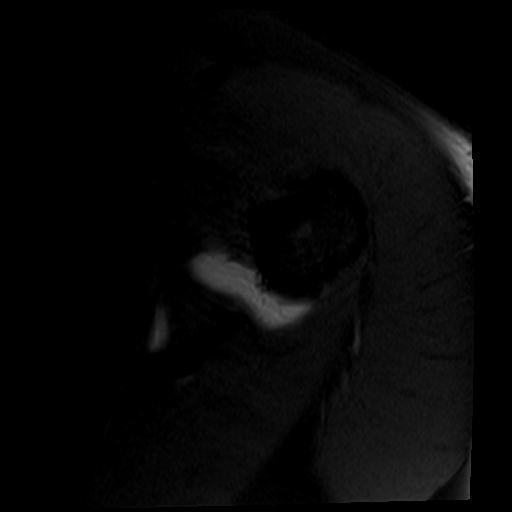
[im 5/18]
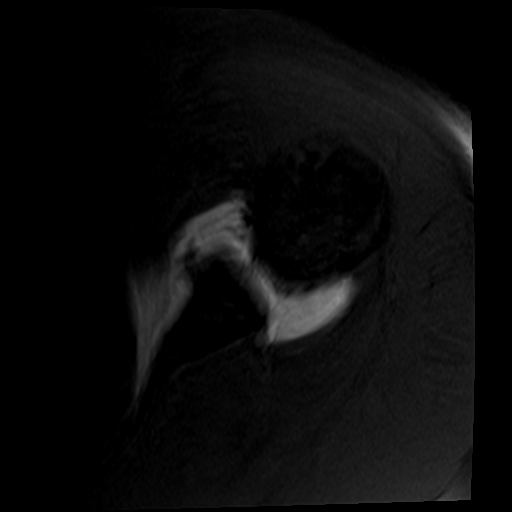
[im 8/18]
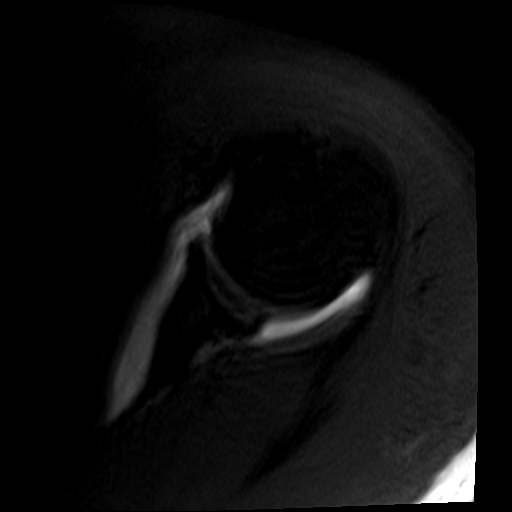
[im 10/18]
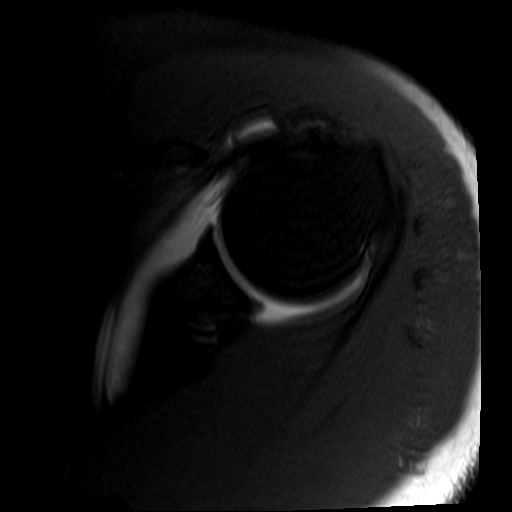
[im 13/18]
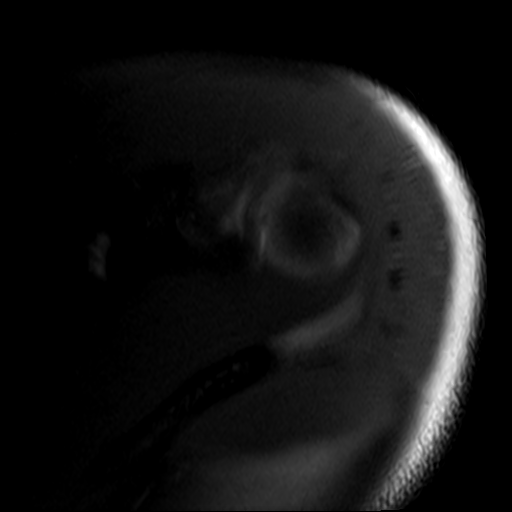
[im 15/18]
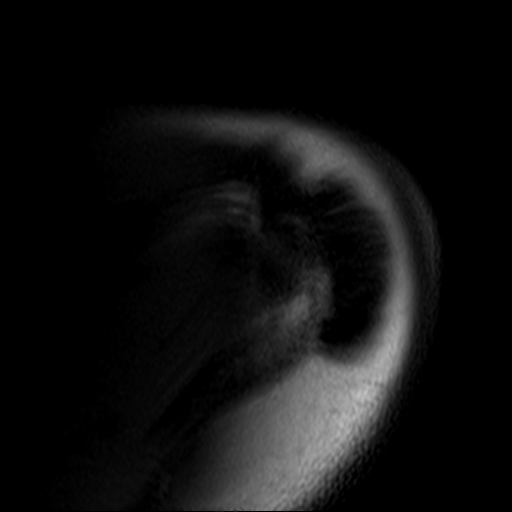
[im 18/18]
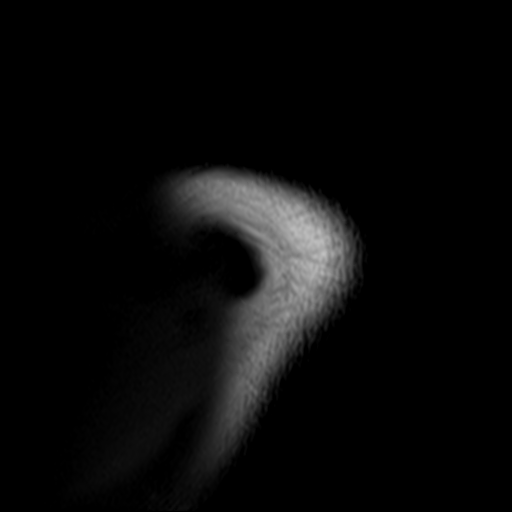

[Series 4: T2 fat-sat · oblique · 4.0mm · 0.55mm/px · 7 of 18 slices shown (1 of 2)]
[im 1/18]
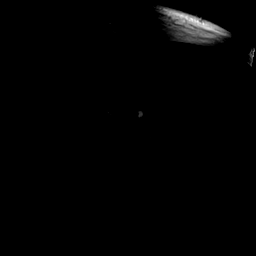
[im 3/18]
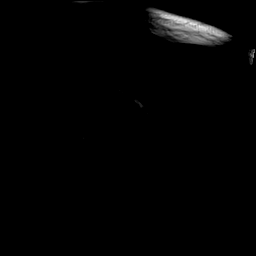
[im 6/18]
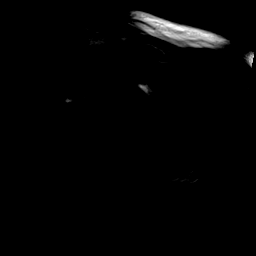
[im 9/18]
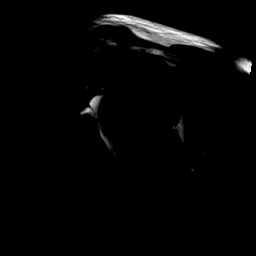
[im 12/18]
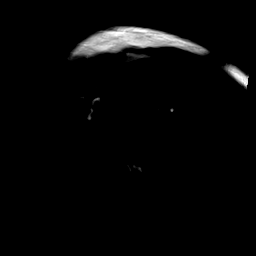
[im 15/18]
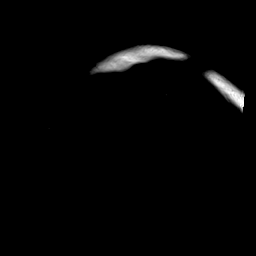
[im 18/18]
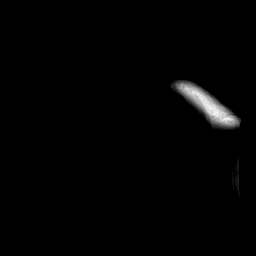

[Series 5: T1 fat-sat · oblique · 4.0mm · 0.55mm/px · 6 of 16 slices shown (2 of 4)]
[im 1/16]
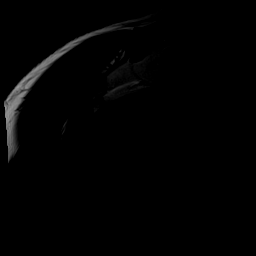
[im 4/16]
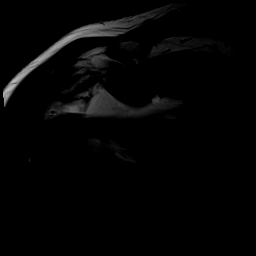
[im 7/16]
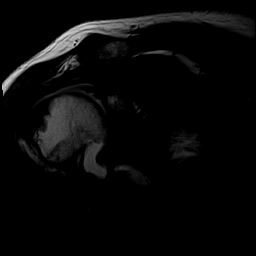
[im 10/16]
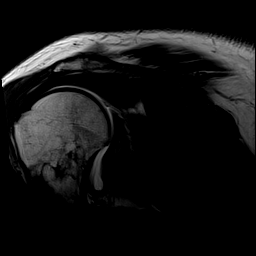
[im 13/16]
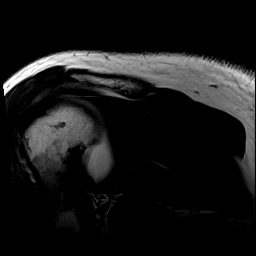
[im 16/16]
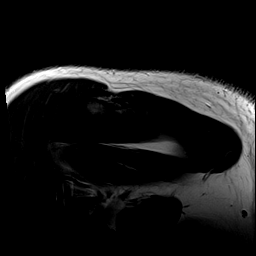

[Series 6: T1 fat-sat · oblique · 4.0mm · 0.55mm/px · 6 of 16 slices shown (3 of 4)]
[im 1/16]
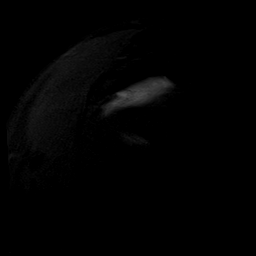
[im 4/16]
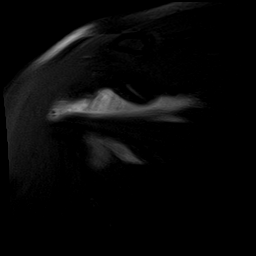
[im 7/16]
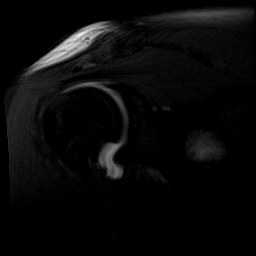
[im 10/16]
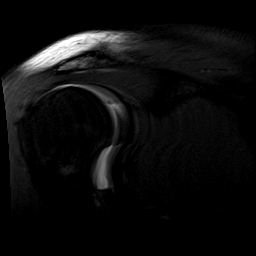
[im 13/16]
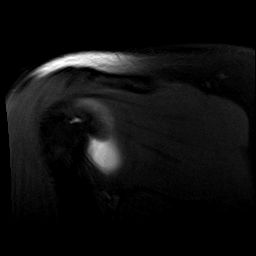
[im 16/16]
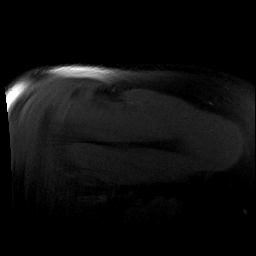

[Series 7: T2 fat-sat · oblique · 4.0mm · 0.55mm/px · 6 of 16 slices shown (2 of 2)]
[im 1/16]
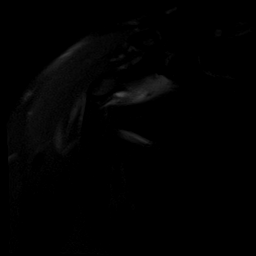
[im 4/16]
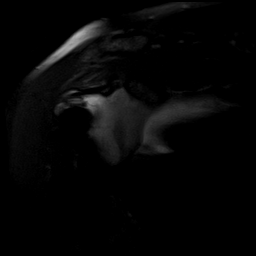
[im 7/16]
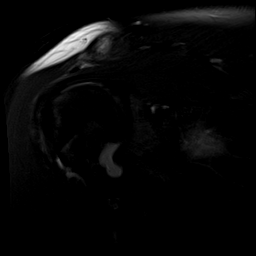
[im 10/16]
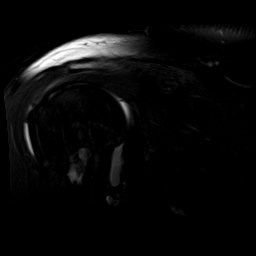
[im 13/16]
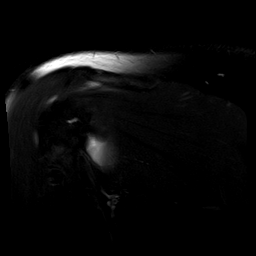
[im 16/16]
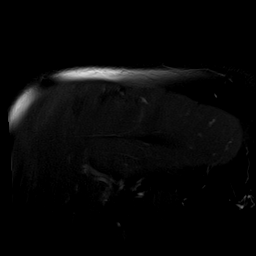

[Series 8: T1 fat-sat · axial · 4.0mm · 0.27mm/px · z∈[+6,+31]mm · 3 of 18 slices shown (4 of 4)]
[im 1/18]
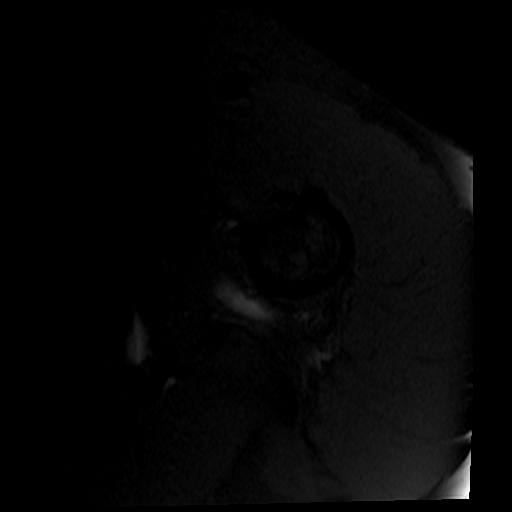
[im 3/18]
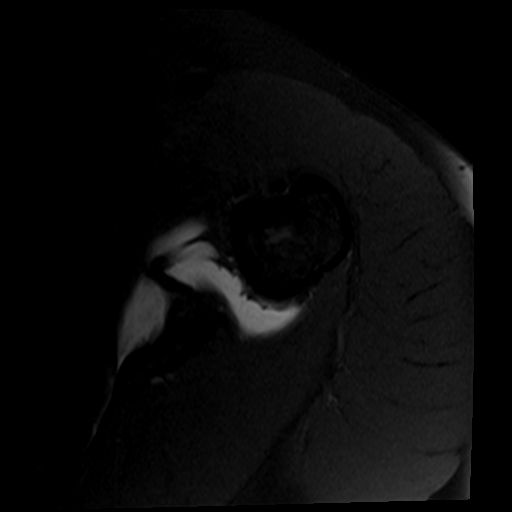
[im 6/18]
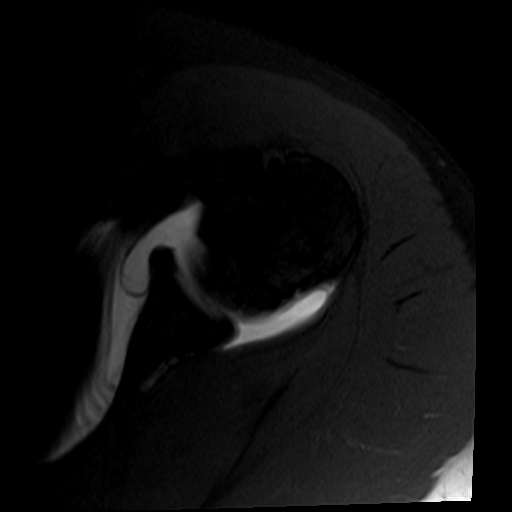

[36 of 40 positions shown; findings below may reference images not displayed]

FINDINGS: The exam is degraded by patient motion.

Rotator cuff: Rotator cuff tendinopathy is most notable in the
supraspinatus. There is a 0.3 cm from front to back bursal sided
tear of the far lateral supraspinatus. No retraction.

Muscles: Normal without atrophy or focal lesion.

Biceps long head: Intact.

Acromioclavicular Joint: Intact. There is marked marrow edema about
the joint extending throughout the acromion. The acromion is type 1.
There is no contrast in the subacromial/subdeltoid bursa but there
is a small volume of fluid in the bursa.

Glenohumeral Joint: Negative.

Labrum: No obvious tear evaluation is severely limited due to motion
is identified.

Bones: As above there is a lesion in the proximal humerus which
demonstrates mildly decreased T1 signal and intermediate increased
T2 signal. A small focus of T2 hyperintensity in sclerosis are seen
within the lesion.
IMPRESSION: Motion degraded exam.

Marked marrow edema about the acromioclavicular joint extending
throughout the acromion could be due to degenerative disease but
contusion is also possible. The AC joint is intact and no fracture
is identified.

Rotator cuff tendinopathy with a 0.3 cm from front to back bursal
sided tear of the far lateral supraspinatus. No retraction or
atrophy.

Fluid in the subacromial/subdeltoid bursa compatible with bursitis.

Benign lesion in the proximal humerus has an appearance most
suggestive of burnout fibrous dysplasia

## 2022-08-28 ENCOUNTER — Ambulatory Visit (INDEPENDENT_AMBULATORY_CARE_PROVIDER_SITE_OTHER): Payer: No Typology Code available for payment source | Admitting: Family Medicine

## 2022-08-28 ENCOUNTER — Other Ambulatory Visit (HOSPITAL_BASED_OUTPATIENT_CLINIC_OR_DEPARTMENT_OTHER): Payer: Self-pay

## 2022-08-28 ENCOUNTER — Encounter: Payer: Self-pay | Admitting: Family Medicine

## 2022-08-28 VITALS — BP 118/80 | HR 80 | Temp 98.5°F | Ht 66.25 in | Wt 197.0 lb

## 2022-08-28 DIAGNOSIS — E039 Hypothyroidism, unspecified: Secondary | ICD-10-CM

## 2022-08-28 DIAGNOSIS — Z0000 Encounter for general adult medical examination without abnormal findings: Secondary | ICD-10-CM

## 2022-08-28 LAB — CBC WITH DIFFERENTIAL/PLATELET
Basophils Absolute: 0 10*3/uL (ref 0.0–0.1)
Basophils Relative: 0.5 % (ref 0.0–3.0)
Eosinophils Absolute: 0.2 10*3/uL (ref 0.0–0.7)
Eosinophils Relative: 2.7 % (ref 0.0–5.0)
HCT: 43.1 % (ref 39.0–52.0)
Hemoglobin: 14.7 g/dL (ref 13.0–17.0)
Lymphocytes Relative: 32 % (ref 12.0–46.0)
Lymphs Abs: 2.3 10*3/uL (ref 0.7–4.0)
MCHC: 34.1 g/dL (ref 30.0–36.0)
MCV: 89.7 fl (ref 78.0–100.0)
Monocytes Absolute: 0.6 10*3/uL (ref 0.1–1.0)
Monocytes Relative: 8.2 % (ref 3.0–12.0)
Neutro Abs: 4.1 10*3/uL (ref 1.4–7.7)
Neutrophils Relative %: 56.6 % (ref 43.0–77.0)
Platelets: 241 10*3/uL (ref 150.0–400.0)
RBC: 4.8 Mil/uL (ref 4.22–5.81)
RDW: 13 % (ref 11.5–15.5)
WBC: 7.2 10*3/uL (ref 4.0–10.5)

## 2022-08-28 LAB — BASIC METABOLIC PANEL
BUN: 12 mg/dL (ref 6–23)
CO2: 31 mEq/L (ref 19–32)
Calcium: 9.1 mg/dL (ref 8.4–10.5)
Chloride: 103 mEq/L (ref 96–112)
Creatinine, Ser: 0.98 mg/dL (ref 0.40–1.50)
GFR: 91.46 mL/min (ref >60.00)
Glucose, Bld: 93 mg/dL (ref 70–99)
Potassium: 4.1 mEq/L (ref 3.5–5.1)
Sodium: 139 mEq/L (ref 135–145)

## 2022-08-28 LAB — HEPATIC FUNCTION PANEL
ALT: 37 U/L (ref 0–53)
AST: 25 U/L (ref 0–37)
Albumin: 4.3 g/dL (ref 3.5–5.2)
Alkaline Phosphatase: 62 U/L (ref 39–117)
Bilirubin, Direct: 0.1 mg/dL (ref 0.0–0.3)
Total Bilirubin: 0.6 mg/dL (ref 0.2–1.2)
Total Protein: 6.9 g/dL (ref 6.0–8.3)

## 2022-08-28 LAB — LIPID PANEL
Cholesterol: 183 mg/dL (ref 0–200)
HDL: 45.9 mg/dL (ref >39.00)
LDL Cholesterol: 116 mg/dL — ABNORMAL HIGH (ref 0–99)
NonHDL: 136.72
Total CHOL/HDL Ratio: 4
Triglycerides: 102 mg/dL (ref 0.0–149.0)
VLDL: 20.4 mg/dL (ref 0.0–40.0)

## 2022-08-28 LAB — T3, FREE: T3, Free: 3.5 pg/mL (ref 2.3–4.2)

## 2022-08-28 LAB — HEMOGLOBIN A1C: Hgb A1c MFr Bld: 5.7 % (ref 4.6–6.5)

## 2022-08-28 LAB — T4, FREE: Free T4: 0.87 ng/dL (ref 0.60–1.60)

## 2022-08-28 LAB — TSH: TSH: 4.28 u[IU]/mL (ref 0.35–5.50)

## 2022-08-28 MED ORDER — AMPHETAMINE-DEXTROAMPHET ER 30 MG PO CP24
30.0000 mg | ORAL_CAPSULE | Freq: Two times a day (BID) | ORAL | 0 refills | Status: DC
  Filled 2022-08-28 – 2022-09-14 (×2): qty 180, 90d supply, fill #0

## 2022-08-28 MED ORDER — MELOXICAM 15 MG PO TABS
15.0000 mg | ORAL_TABLET | Freq: Every day | ORAL | 3 refills | Status: DC
  Filled 2022-08-28 – 2023-02-11 (×2): qty 90, 90d supply, fill #0
  Filled 2023-06-05: qty 90, 90d supply, fill #1

## 2022-08-28 NOTE — Progress Notes (Signed)
Subjective:    Patient ID: Patrick Brock, male    DOB: 03-Jan-1976, 46 y.o.   MRN: [id number]  HPI Here for a well exam. He is doing well except his arthritis has been getting a little worse. He has pain in both knees most every day, and his job requires him to stand all day. He usually takes Tylenol or Aleve, but they do not help much.    Review of Systems  Constitutional: Negative.   HENT: Negative.    Eyes: Negative.   Respiratory: Negative.    Cardiovascular: Negative.   Gastrointestinal: Negative.   Genitourinary: Negative.   Musculoskeletal:  Positive for arthralgias.  Skin: Negative.   Neurological: Negative.   Psychiatric/Behavioral: Negative.         Objective:   Physical Exam Constitutional:      General: He is not in acute distress.    Appearance: Normal appearance. He is well-developed. He is not diaphoretic.  HENT:     Head: Normocephalic and atraumatic.     Right Ear: External ear normal.     Left Ear: External ear normal.     Nose: Nose normal.     Mouth/Throat:     Pharynx: No oropharyngeal exudate.  Eyes:     General: No scleral icterus.       Right eye: No discharge.        Left eye: No discharge.     Conjunctiva/sclera: Conjunctivae normal.     Pupils: Pupils are equal, round, and reactive to light.  Neck:     Thyroid: No thyromegaly.     Vascular: No JVD.     Trachea: No tracheal deviation.  Cardiovascular:     Rate and Rhythm: Normal rate and regular rhythm.     Heart sounds: Normal heart sounds. No murmur heard.    No friction rub. No gallop.  Pulmonary:     Effort: Pulmonary effort is normal. No respiratory distress.     Breath sounds: Normal breath sounds. No wheezing or rales.  Chest:     Chest wall: No tenderness.  Abdominal:     General: Bowel sounds are normal. There is no distension.     Palpations: Abdomen is soft. There is no mass.     Tenderness: There is no abdominal tenderness. There is no guarding or rebound.   Genitourinary:    Penis: Normal. No tenderness.      Testes: Normal.  Musculoskeletal:        General: No tenderness. Normal range of motion.     Cervical back: Neck supple.  Lymphadenopathy:     Cervical: No cervical adenopathy.  Skin:    General: Skin is warm and dry.     Coloration: Skin is not pale.     Findings: No erythema or rash.  Neurological:     Mental Status: He is alert and oriented to person, place, and time.     Cranial Nerves: No cranial nerve deficit.     Motor: No abnormal muscle tone.     Coordination: Coordination normal.     Deep Tendon Reflexes: Reflexes are normal and symmetric. Reflexes normal.  Psychiatric:        Behavior: Behavior normal.        Thought Content: Thought content normal.        Judgment: Judgment normal.           Assessment & Plan:  Well exam. We discussed diet and exercise. Get fasting labs. For the OA,  he will try Meloxicam 15 mg daily.  Alysia Penna, MD

## 2022-09-07 ENCOUNTER — Other Ambulatory Visit (HOSPITAL_BASED_OUTPATIENT_CLINIC_OR_DEPARTMENT_OTHER): Payer: Self-pay

## 2022-09-14 ENCOUNTER — Other Ambulatory Visit (HOSPITAL_BASED_OUTPATIENT_CLINIC_OR_DEPARTMENT_OTHER): Payer: Self-pay

## 2022-10-30 ENCOUNTER — Other Ambulatory Visit (HOSPITAL_BASED_OUTPATIENT_CLINIC_OR_DEPARTMENT_OTHER): Payer: Self-pay

## 2022-10-30 ENCOUNTER — Other Ambulatory Visit: Payer: Self-pay | Admitting: Family Medicine

## 2022-10-30 MED ORDER — LEVOTHYROXINE SODIUM 50 MCG PO TABS
50.0000 ug | ORAL_TABLET | Freq: Every day | ORAL | 1 refills | Status: DC
  Filled 2022-10-30: qty 90, 90d supply, fill #0
  Filled 2023-02-11: qty 90, 90d supply, fill #1

## 2022-12-12 ENCOUNTER — Other Ambulatory Visit: Payer: Self-pay | Admitting: Family Medicine

## 2022-12-12 ENCOUNTER — Other Ambulatory Visit (HOSPITAL_BASED_OUTPATIENT_CLINIC_OR_DEPARTMENT_OTHER): Payer: Self-pay

## 2022-12-12 MED ORDER — AMPHETAMINE-DEXTROAMPHET ER 30 MG PO CP24
30.0000 mg | ORAL_CAPSULE | Freq: Two times a day (BID) | ORAL | 0 refills | Status: DC
  Filled 2022-12-12: qty 180, 90d supply, fill #0

## 2022-12-12 NOTE — Telephone Encounter (Signed)
Pt LOV was on 08/28/22 Last refill was done on 08/28/22 Please advise

## 2023-01-17 ENCOUNTER — Other Ambulatory Visit (HOSPITAL_BASED_OUTPATIENT_CLINIC_OR_DEPARTMENT_OTHER): Payer: Self-pay

## 2023-02-11 ENCOUNTER — Other Ambulatory Visit (HOSPITAL_BASED_OUTPATIENT_CLINIC_OR_DEPARTMENT_OTHER): Payer: Self-pay

## 2023-03-19 ENCOUNTER — Other Ambulatory Visit: Payer: Self-pay | Admitting: Family Medicine

## 2023-03-21 ENCOUNTER — Other Ambulatory Visit (HOSPITAL_BASED_OUTPATIENT_CLINIC_OR_DEPARTMENT_OTHER): Payer: Self-pay

## 2023-03-21 MED ORDER — AMPHETAMINE-DEXTROAMPHET ER 30 MG PO CP24
30.0000 mg | ORAL_CAPSULE | Freq: Two times a day (BID) | ORAL | 0 refills | Status: DC
  Filled 2023-03-21: qty 180, 90d supply, fill #0

## 2023-03-21 NOTE — Telephone Encounter (Signed)
Done

## 2023-06-05 ENCOUNTER — Other Ambulatory Visit: Payer: Self-pay

## 2023-06-05 ENCOUNTER — Other Ambulatory Visit: Payer: Self-pay | Admitting: Family Medicine

## 2023-06-06 ENCOUNTER — Other Ambulatory Visit (HOSPITAL_BASED_OUTPATIENT_CLINIC_OR_DEPARTMENT_OTHER): Payer: Self-pay

## 2023-06-06 MED ORDER — LEVOTHYROXINE SODIUM 50 MCG PO TABS
50.0000 ug | ORAL_TABLET | Freq: Every day | ORAL | 1 refills | Status: DC
  Filled 2023-06-06: qty 90, 90d supply, fill #0
  Filled 2023-07-04 – 2023-09-12 (×2): qty 90, 90d supply, fill #1

## 2023-07-04 ENCOUNTER — Other Ambulatory Visit: Payer: Self-pay | Admitting: Family Medicine

## 2023-07-04 ENCOUNTER — Other Ambulatory Visit (HOSPITAL_BASED_OUTPATIENT_CLINIC_OR_DEPARTMENT_OTHER): Payer: Self-pay

## 2023-07-05 ENCOUNTER — Other Ambulatory Visit (HOSPITAL_BASED_OUTPATIENT_CLINIC_OR_DEPARTMENT_OTHER): Payer: Self-pay

## 2023-07-05 MED ORDER — AMPHETAMINE-DEXTROAMPHET ER 30 MG PO CP24
30.0000 mg | ORAL_CAPSULE | Freq: Two times a day (BID) | ORAL | 0 refills | Status: DC
  Filled 2023-07-05: qty 40, 20d supply, fill #0
  Filled 2023-07-11: qty 140, 70d supply, fill #0

## 2023-07-05 NOTE — Telephone Encounter (Signed)
Pt LOV was on 08/28/22 Last refill done on 03/21/23 Please advise

## 2023-07-05 NOTE — Telephone Encounter (Signed)
Done

## 2023-07-09 ENCOUNTER — Other Ambulatory Visit (HOSPITAL_BASED_OUTPATIENT_CLINIC_OR_DEPARTMENT_OTHER): Payer: Self-pay

## 2023-07-11 ENCOUNTER — Other Ambulatory Visit: Payer: Self-pay

## 2023-07-11 ENCOUNTER — Other Ambulatory Visit (HOSPITAL_BASED_OUTPATIENT_CLINIC_OR_DEPARTMENT_OTHER): Payer: Self-pay

## 2023-07-12 ENCOUNTER — Other Ambulatory Visit (HOSPITAL_BASED_OUTPATIENT_CLINIC_OR_DEPARTMENT_OTHER): Payer: Self-pay

## 2023-07-16 ENCOUNTER — Other Ambulatory Visit (HOSPITAL_BASED_OUTPATIENT_CLINIC_OR_DEPARTMENT_OTHER): Payer: Self-pay

## 2023-07-18 ENCOUNTER — Other Ambulatory Visit (HOSPITAL_BASED_OUTPATIENT_CLINIC_OR_DEPARTMENT_OTHER): Payer: Self-pay

## 2023-07-31 ENCOUNTER — Other Ambulatory Visit (HOSPITAL_BASED_OUTPATIENT_CLINIC_OR_DEPARTMENT_OTHER): Payer: Self-pay

## 2023-08-02 ENCOUNTER — Other Ambulatory Visit (HOSPITAL_BASED_OUTPATIENT_CLINIC_OR_DEPARTMENT_OTHER): Payer: Self-pay

## 2023-08-05 ENCOUNTER — Other Ambulatory Visit (HOSPITAL_BASED_OUTPATIENT_CLINIC_OR_DEPARTMENT_OTHER): Payer: Self-pay

## 2023-08-08 ENCOUNTER — Ambulatory Visit: Payer: 59

## 2023-08-08 ENCOUNTER — Ambulatory Visit
Admission: EM | Admit: 2023-08-08 | Discharge: 2023-08-08 | Disposition: A | Discharge status: Home / Self Care | Payer: 59 | Attending: Family Medicine | Admitting: Family Medicine

## 2023-08-08 ENCOUNTER — Other Ambulatory Visit: Payer: Self-pay

## 2023-08-08 DIAGNOSIS — R030 Elevated blood-pressure reading, without diagnosis of hypertension: Secondary | ICD-10-CM | POA: Diagnosis not present

## 2023-08-08 DIAGNOSIS — R519 Headache, unspecified: Secondary | ICD-10-CM

## 2023-08-08 DIAGNOSIS — G43919 Migraine, unspecified, intractable, without status migrainosus: Secondary | ICD-10-CM | POA: Diagnosis not present

## 2023-08-08 DIAGNOSIS — R200 Anesthesia of skin: Secondary | ICD-10-CM | POA: Diagnosis not present

## 2023-08-08 DIAGNOSIS — R29810 Facial weakness: Secondary | ICD-10-CM

## 2023-08-08 DIAGNOSIS — R29818 Other symptoms and signs involving the nervous system: Secondary | ICD-10-CM | POA: Diagnosis not present

## 2023-08-08 MED ORDER — ONDANSETRON HCL 8 MG PO TABS
8.0000 mg | ORAL_TABLET | Freq: Three times a day (TID) | ORAL | 0 refills | Status: AC | PRN
  Filled 2023-08-09: qty 20, 7d supply, fill #0

## 2023-08-08 MED ORDER — BUTALBITAL-APAP-CAFFEINE 50-325-40 MG PO TABS
1.0000 | ORAL_TABLET | Freq: Four times a day (QID) | ORAL | 0 refills | Status: AC | PRN
Start: 2023-08-08 — End: 2024-08-07
  Filled 2023-08-09: qty 20, 3d supply, fill #0

## 2023-08-08 MED ORDER — KETOROLAC TROMETHAMINE 30 MG/ML IJ SOLN
30.0000 mg | Freq: Once | INTRAMUSCULAR | Status: AC
  Administered 2023-08-08: 30 mg via INTRAMUSCULAR

## 2023-08-08 NOTE — ED Triage Notes (Signed)
Onset of pounding headache yesterday, which has continued into today. No n/v. Felt jittery yesterday.

## 2023-08-08 NOTE — ED Provider Notes (Signed)
[facility]    CSN: [id number] Arrival date & time: 08/08/23  0915      History   Chief Complaint Chief Complaint  Patient presents with   Headache    HPI Patrick Brock is a 47 y.o. male.   This is a healthy 47 year old gentleman who is here today for headache.  He states that it is a progressive headache that is getting worse over the last day or 2.  It is a pounding headache.  He states is the worst headache he has ever had.  He has decreased appetite but no nausea or vomiting.  Mild photophobia.  It is not improving with over-the-counter medication.  He has not had any head injury.  He does not have any sinus or allergy symptoms.  He states he is concerned because today he noted numbness on the right side of his face.  He has never had this with a headache before.  He states he has had headaches previously but has not been diagnosed with a migraine.  Mentation is good.  He states when the pain is severe he has trouble focusing.  No trouble with arms or legs, balance or strength    Past Medical History:  Diagnosis Date   ADHD (attention deficit hyperactivity disorder)    Allergy    SEASONAL   Anxiety states    Esophageal reflux    Esophagitis    Hiatal hernia    Hypertension    Hypogonadism male    Hypothyroidism    Low back pain    Thyroid disease     Patient Active Problem List   Diagnosis Date Noted   OA (osteoarthritis) 07/11/2021   Hypothyroidism 12/05/2018   GERD (gastroesophageal reflux disease) 06/05/2011   HYPOGONADISM 07/14/2010   MYALGIA 05/15/2010   ADHD 06/28/2009   ANXIETY 08/08/2007   LOW BACK PAIN 08/08/2007    No past surgical history on file.     Home Medications    Prior to Admission medications   Medication Sig Start Date End Date Taking? Authorizing Provider  butalbital-acetaminophen-caffeine (FIORICET) 50-325-40 MG tablet Take 1-2 tablets by mouth every 6 (six) hours as needed for headache. 08/08/23 08/07/24 Yes Eustace Moore, MD  ondansetron (ZOFRAN) 8 MG tablet Take 1 tablet (8 mg total) by mouth every 8 (eight) hours as needed. 08/08/23  Yes Eustace Moore, MD  amphetamine-dextroamphetamine (ADDERALL XR) 30 MG 24 hr capsule Take 1 capsule (30 mg total) by mouth 2 (two) times daily. 07/05/23   Nelwyn Salisbury, MD  levothyroxine (SYNTHROID) 50 MCG tablet Take 1 tablet (50 mcg total) by mouth daily. 06/06/23 06/05/24  Nelwyn Salisbury, MD  meloxicam (MOBIC) 15 MG tablet Take 1 tablet (15 mg total) by mouth daily. 08/28/22   Nelwyn Salisbury, MD    Family History Family History  Problem Relation Age of Onset   Cancer Mother        ovarian   Heart disease Father    Colon cancer Neg Hx     Social History Social History   Tobacco Use   Smoking status: Never   Smokeless tobacco: Never  Vaping Use   Vaping status: Never Used  Substance Use Topics   Alcohol use: No    Alcohol/week: 0.0 standard drinks of alcohol   Drug use: No     Allergies   Erythromycin   Review of Systems Review of Systems  See HPI Physical Exam Triage Vital Signs ED Triage Vitals  Encounter Vitals Group     BP 08/08/23 0922 (!) 151/80     Systolic BP Percentile --      Diastolic BP Percentile --      Pulse Rate 08/08/23 0922 92     Resp 08/08/23 0922 16     Temp 08/08/23 0922 98.7 F (37.1 C)     Temp src --      SpO2 08/08/23 0922 99 %     Weight --      Height --      Head Circumference --      Peak Flow --      Pain Score 08/08/23 0923 9     Pain Loc --      Pain Education --      Exclude from Growth Chart --    No data found.  Updated Vital Signs BP 134/79   Pulse 80   Temp 98.7 F (37.1 C)   Resp 16   SpO2 99%   :     Physical Exam Constitutional:      General: He is not in acute distress.    Appearance: He is well-developed and normal weight.  HENT:     Head: Normocephalic and atraumatic.     Mouth/Throat:     Mouth: Mucous membranes are moist.     Pharynx: Oropharynx is clear.   Eyes:     General: No visual field deficit.    Extraocular Movements: Extraocular movements intact.     Right eye: Normal extraocular motion.     Left eye: Normal extraocular motion.     Conjunctiva/sclera: Conjunctivae normal.     Pupils: Pupils are equal, round, and reactive to light.     Comments: Discs are flat  Neck:     Meningeal: Kernig's sign absent.  Cardiovascular:     Rate and Rhythm: Normal rate and regular rhythm.     Heart sounds: Normal heart sounds.  Pulmonary:     Effort: Pulmonary effort is normal. No respiratory distress.     Breath sounds: Normal breath sounds.  Musculoskeletal:        General: Normal range of motion.     Cervical back: Normal range of motion. No rigidity.  Skin:    General: Skin is warm and dry.  Neurological:     Mental Status: He is alert.     Cranial Nerves: No cranial nerve deficit, dysarthria or facial asymmetry.     Sensory: Sensory deficit present.  Psychiatric:        Mood and Affect: Mood normal.        Speech: Speech normal.        Behavior: Behavior normal.      UC Treatments / Results  Labs (all labs ordered are listed, but only abnormal results are displayed) Labs Reviewed - No data to display  EKG   Radiology CT Head Wo Contrast  Result Date: 08/08/2023 CLINICAL DATA:  Headache, neuro deficit. Worst headache of life. Right face numbness. EXAM: CT HEAD WITHOUT CONTRAST TECHNIQUE: Contiguous axial images were obtained from the base of the skull through the vertex without intravenous contrast. RADIATION DOSE REDUCTION: This exam was performed according to the departmental dose-optimization program which includes automated exposure control, adjustment of the mA and/or kV according to patient size and/or use of iterative reconstruction technique. COMPARISON:  Head CT and MRI 05/05/2010 FINDINGS: Brain: There is no evidence of an acute infarct, intracranial hemorrhage, mass, midline shift, or extra-axial fluid collection.  Mild  prominence of the left greater than right lateral ventricles is unchanged and likely developmental. The third and fourth ventricles are normal in size. Vascular: No hyperdense vessel. Skull: No acute fracture or suspicious osseous lesion. Sinuses/Orbits: Visualized paranasal sinuses and mastoid air cells are clear. Unremarkable orbits. Other: None. IMPRESSION: No evidence of acute intracranial abnormality. Electronically Signed   By: Sebastian Ache M.D.   On: 08/08/2023 11:50    Procedures Procedures (including critical care time)  Medications Ordered in UC Medications  ketorolac (TORADOL) 30 MG/ML injection 30 mg (30 mg Intramuscular Given 08/08/23 1006)    Initial Impression / Assessment and Plan / UC Course  I have reviewed the triage vital signs and the nursing notes.  Pertinent labs & imaging results that were available during my care of the patient were reviewed by me and considered in my medical decision making (see chart for details).     Exam is reassuring.  Because of the patient's severity of headache and neurologic symptoms a CT of head was done.  It is negative.  This is discussed with patient.  He only got partial pain relief from the Toradol injection.  I believe there is some anxiety involved.  I am giving a prescription for Fioricet to help with headache along with Zofran for nausea. Final Clinical Impressions(s) / UC Diagnoses   Final diagnoses:  Intractable migraine without status migrainosus, unspecified migraine type  Elevated blood pressure reading     Discharge Instructions      Go home and rest.  Stay in quiet, darkened environment Take Fioricet if needed for headache Take Zofran if needed for nausea and vomiting Call Dr. Clent Ridges if not improving by next week     ED Prescriptions     Medication Sig Dispense Auth. Provider   butalbital-acetaminophen-caffeine (FIORICET) 50-325-40 MG tablet Take 1-2 tablets by mouth every 6 (six) hours as needed for  headache. 20 tablet Eustace Moore, MD   ondansetron (ZOFRAN) 8 MG tablet Take 1 tablet (8 mg total) by mouth every 8 (eight) hours as needed. 20 tablet Eustace Moore, MD      I have reviewed the PDMP during this encounter.   Eustace Moore, MD 08/08/23 [phone]

## 2023-08-08 NOTE — Discharge Instructions (Signed)
Go home and rest.  Stay in quiet, darkened environment Take Fioricet if needed for headache Take Zofran if needed for nausea and vomiting Call Dr. Clent Ridges if not improving by next week

## 2023-08-09 ENCOUNTER — Other Ambulatory Visit (HOSPITAL_BASED_OUTPATIENT_CLINIC_OR_DEPARTMENT_OTHER): Payer: Self-pay

## 2023-08-14 ENCOUNTER — Other Ambulatory Visit (HOSPITAL_BASED_OUTPATIENT_CLINIC_OR_DEPARTMENT_OTHER): Payer: Self-pay

## 2023-08-14 ENCOUNTER — Ambulatory Visit (INDEPENDENT_AMBULATORY_CARE_PROVIDER_SITE_OTHER): Payer: 59 | Admitting: Family Medicine

## 2023-08-14 ENCOUNTER — Encounter: Payer: Self-pay | Admitting: Family Medicine

## 2023-08-14 VITALS — BP 132/80 | HR 91 | Temp 98.4°F | Wt 197.0 lb

## 2023-08-14 DIAGNOSIS — R519 Headache, unspecified: Secondary | ICD-10-CM

## 2023-08-14 MED ORDER — SUMATRIPTAN SUCCINATE 100 MG PO TABS
100.0000 mg | ORAL_TABLET | ORAL | 5 refills | Status: AC | PRN
  Filled 2023-08-14: qty 18, 30d supply, fill #0

## 2023-08-14 MED ORDER — METHYLPREDNISOLONE ACETATE 40 MG/ML IJ SUSP
40.0000 mg | Freq: Once | INTRAMUSCULAR | Status: AC
Start: 2023-08-14 — End: 2023-08-14
  Administered 2023-08-14: 40 mg via INTRAMUSCULAR

## 2023-08-14 MED ORDER — METHYLPREDNISOLONE ACETATE 80 MG/ML IJ SUSP
80.0000 mg | Freq: Once | INTRAMUSCULAR | Status: AC
Start: 2023-08-14 — End: 2023-08-14
  Administered 2023-08-14: 80 mg via INTRAMUSCULAR

## 2023-08-14 NOTE — Addendum Note (Signed)
Addended by: Carola Rhine on: 08/14/2023 05:09 PM   Modules accepted: Orders

## 2023-08-14 NOTE — Progress Notes (Signed)
   Subjective:    Patient ID: Patrick Brock, male    DOB: 01/14/1976, 47 y.o.   MRN: [id number]  HPI Here for a constant headache that began about 3 weeks ago. He has never had problems with headaches in the past. The pain waxes and wanes. Most of the pain is centered on the right side of his head, though it can involve the entire head. He describes it as a throbbing or pounding headache. It can cause him to feel nauseated, but he has not vomited. Bright lights make it worse. He has tried Ibuprofen and BC powders at home with no relief. He went to an urgent care on 08-08-23, and his exam was normal. They also got a non-contrasted head CT which was normal. They gave him a shot of Toradol, but this did not help. They also gave him Fiorocet. This helps take some of the edge off the pain, but it does not last long. He notes that when he is at work the headache is worst inside his building. When he goes outside the headache often eases up, but then it gets bad again when he goes back inside. He discovered that someone had been burning a pumpkin scented candle at work, and he wonders if this could triggering the headache. He also notes a tingling or numbness sensation in the cheeks when the headache gets bad. The right side gets more numb than the left. No family history of headaches.    Review of Systems  Constitutional: Negative.   HENT: Negative.    Eyes:  Positive for photophobia.  Respiratory: Negative.    Cardiovascular: Negative.   Neurological:  Positive for numbness and headaches. Negative for dizziness, tremors, seizures, syncope, facial asymmetry, speech difficulty, weakness and light-headedness.       Objective:   Physical Exam Constitutional:      Appearance: Normal appearance. He is not ill-appearing.  HENT:     Head: Normocephalic and atraumatic.     Right Ear: Tympanic membrane, ear canal and external ear normal.     Left Ear: Tympanic membrane, ear canal and external ear normal.      Nose: Nose normal.     Mouth/Throat:     Pharynx: Oropharynx is clear.  Eyes:     Pupils: Pupils are equal, round, and reactive to light.     Comments: He has some photophobia from the fluorescent lights   Cardiovascular:     Rate and Rhythm: Normal rate and regular rhythm.     Pulses: Normal pulses.     Heart sounds: Normal heart sounds.  Pulmonary:     Effort: Pulmonary effort is normal.     Breath sounds: Normal breath sounds.  Neurological:     General: No focal deficit present.     Mental Status: He is alert and oriented to person, place, and time.           Assessment & Plan:  He has had a subacute headache which is most likely a migraine. We will give him a shot of DepoMedrol ,and he will try Sumatriptan 100 mg as needed. He will avoid bright lights, loud noises, and strong odors or scents. Follow up as needed. We spent a total of ( 31  ) minutes reviewing records and discussing these issues.   Gershon Crane, MD

## 2023-09-17 ENCOUNTER — Other Ambulatory Visit: Payer: Self-pay | Admitting: Family Medicine

## 2023-09-17 ENCOUNTER — Other Ambulatory Visit (HOSPITAL_BASED_OUTPATIENT_CLINIC_OR_DEPARTMENT_OTHER): Payer: Self-pay

## 2023-09-18 ENCOUNTER — Other Ambulatory Visit (HOSPITAL_BASED_OUTPATIENT_CLINIC_OR_DEPARTMENT_OTHER): Payer: Self-pay

## 2023-09-18 MED ORDER — MELOXICAM 15 MG PO TABS
15.0000 mg | ORAL_TABLET | Freq: Every day | ORAL | 3 refills | Status: DC
  Filled 2023-09-18 – 2023-10-11 (×2): qty 90, 90d supply, fill #0
  Filled 2024-01-24: qty 30, 30d supply, fill #1
  Filled 2024-03-03: qty 30, 30d supply, fill #2
  Filled 2024-03-31: qty 30, 30d supply, fill #3
  Filled 2024-05-12: qty 30, 30d supply, fill #4
  Filled 2024-06-16: qty 30, 30d supply, fill #5
  Filled 2024-07-20: qty 30, 30d supply, fill #6
  Filled 2024-09-01 – 2024-09-17 (×2): qty 30, 30d supply, fill #7

## 2023-09-19 ENCOUNTER — Other Ambulatory Visit (HOSPITAL_BASED_OUTPATIENT_CLINIC_OR_DEPARTMENT_OTHER): Payer: Self-pay

## 2023-09-30 ENCOUNTER — Other Ambulatory Visit (HOSPITAL_BASED_OUTPATIENT_CLINIC_OR_DEPARTMENT_OTHER): Payer: Self-pay

## 2023-10-11 ENCOUNTER — Other Ambulatory Visit (HOSPITAL_BASED_OUTPATIENT_CLINIC_OR_DEPARTMENT_OTHER): Payer: Self-pay

## 2023-10-11 ENCOUNTER — Other Ambulatory Visit: Payer: Self-pay

## 2023-10-11 ENCOUNTER — Other Ambulatory Visit: Payer: Self-pay | Admitting: Family Medicine

## 2023-10-11 MED ORDER — AMPHETAMINE-DEXTROAMPHET ER 30 MG PO CP24
30.0000 mg | ORAL_CAPSULE | Freq: Two times a day (BID) | ORAL | 0 refills | Status: DC
Start: 2023-10-11 — End: 2024-01-24
  Filled 2023-10-11: qty 180, 90d supply, fill #0

## 2023-10-11 NOTE — Telephone Encounter (Signed)
Done

## 2023-10-11 NOTE — Telephone Encounter (Signed)
Pt LOV was 08/14/23

## 2023-10-17 ENCOUNTER — Other Ambulatory Visit (HOSPITAL_BASED_OUTPATIENT_CLINIC_OR_DEPARTMENT_OTHER): Payer: Self-pay

## 2023-10-17 ENCOUNTER — Ambulatory Visit (INDEPENDENT_AMBULATORY_CARE_PROVIDER_SITE_OTHER): Payer: 59 | Admitting: Family Medicine

## 2023-10-17 ENCOUNTER — Encounter: Payer: Self-pay | Admitting: Family Medicine

## 2023-10-17 VITALS — BP 124/76 | HR 86 | Temp 98.9°F | Wt 193.0 lb

## 2023-10-17 DIAGNOSIS — G8929 Other chronic pain: Secondary | ICD-10-CM

## 2023-10-17 DIAGNOSIS — M25511 Pain in right shoulder: Secondary | ICD-10-CM

## 2023-10-17 DIAGNOSIS — D171 Benign lipomatous neoplasm of skin and subcutaneous tissue of trunk: Secondary | ICD-10-CM | POA: Diagnosis not present

## 2023-10-17 DIAGNOSIS — M25512 Pain in left shoulder: Secondary | ICD-10-CM

## 2023-10-17 MED ORDER — METHYLPREDNISOLONE 4 MG PO TBPK
ORAL_TABLET | ORAL | 0 refills | Status: AC
  Filled 2023-10-17: qty 21, 6d supply, fill #0

## 2023-10-17 NOTE — Progress Notes (Signed)
   Subjective:    Patient ID: Patrick Brock, male    DOB: Jan 11, 1976, 47 y.o.   MRN: [id number]  HPI Here for 2 issues. First he has had pain in both shoulders for about 5 years, and in 2021 he saw Dr. Dorene Grebe for this. An MRI revealed a partial rotator cuff tear on the left side. He gave Patrick Brock a cortisone injection, and this helped for awhile. Now for the past few months his pain in both shoulder has gotten worse. He applies alternating ice and heat, and he takes Ibuprofen. The other issue is a lump in the left side that he discovered a week ago. This does not bother him at all.    Review of Systems  Constitutional: Negative.   Respiratory: Negative.    Cardiovascular: Negative.   Musculoskeletal:  Positive for arthralgias.       Objective:   Physical Exam Constitutional:      Appearance: Normal appearance. He is not ill-appearing.  Cardiovascular:     Rate and Rhythm: Normal rate and regular rhythm.     Pulses: Normal pulses.     Heart sounds: Normal heart sounds.  Pulmonary:     Effort: Pulmonary effort is normal.     Breath sounds: Normal breath sounds.  Musculoskeletal:     Comments: Both shoulders have crepitus and reduced ROM. They are both tender in the anterior spaces. There is also a firm mobile non-tender 3 cm mass in the left flank just beneath the skin  Neurological:     Mental Status: He is alert.           Assessment & Plan:  For the bilateral shoulder pain, we will give him a Medrol dose pack and we will refer him back to Dr. August Saucer. 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 I explained that these are benign and we agreed to simply watch this for now.  Gershon Crane, MD

## 2023-11-13 ENCOUNTER — Encounter: Payer: 59 | Admitting: Orthopedic Surgery

## 2023-12-18 ENCOUNTER — Other Ambulatory Visit (HOSPITAL_BASED_OUTPATIENT_CLINIC_OR_DEPARTMENT_OTHER): Payer: Self-pay

## 2023-12-18 ENCOUNTER — Other Ambulatory Visit: Payer: Self-pay | Admitting: Family Medicine

## 2023-12-18 MED ORDER — LEVOTHYROXINE SODIUM 50 MCG PO TABS
50.0000 ug | ORAL_TABLET | Freq: Every day | ORAL | 0 refills | Status: DC
  Filled 2023-12-18: qty 30, 30d supply, fill #0

## 2024-01-24 ENCOUNTER — Other Ambulatory Visit: Payer: Self-pay | Admitting: Family Medicine

## 2024-01-24 ENCOUNTER — Other Ambulatory Visit (HOSPITAL_BASED_OUTPATIENT_CLINIC_OR_DEPARTMENT_OTHER): Payer: Self-pay

## 2024-01-24 ENCOUNTER — Other Ambulatory Visit: Payer: Self-pay

## 2024-01-24 MED ORDER — LEVOTHYROXINE SODIUM 50 MCG PO TABS
50.0000 ug | ORAL_TABLET | Freq: Every day | ORAL | 1 refills | Status: DC
  Filled 2024-01-24: qty 30, 30d supply, fill #0
  Filled 2024-03-03: qty 30, 30d supply, fill #1

## 2024-01-24 NOTE — Telephone Encounter (Signed)
 Pt LOV was on 10/17/23 Last refill for Adderall was done on 10/11/23 Please advise

## 2024-01-27 ENCOUNTER — Other Ambulatory Visit (HOSPITAL_BASED_OUTPATIENT_CLINIC_OR_DEPARTMENT_OTHER): Payer: Self-pay

## 2024-01-27 MED ORDER — AMPHETAMINE-DEXTROAMPHET ER 30 MG PO CP24
30.0000 mg | ORAL_CAPSULE | Freq: Two times a day (BID) | ORAL | 0 refills | Status: AC
Start: 2024-01-27 — End: ?
  Filled 2024-01-27: qty 180, 90d supply, fill #0
  Filled 2024-01-31: qty 60, 30d supply, fill #0
  Filled 2024-03-03: qty 60, 30d supply, fill #1

## 2024-01-27 NOTE — Telephone Encounter (Signed)
 Done

## 2024-01-28 ENCOUNTER — Other Ambulatory Visit (HOSPITAL_BASED_OUTPATIENT_CLINIC_OR_DEPARTMENT_OTHER): Payer: Self-pay

## 2024-01-28 ENCOUNTER — Other Ambulatory Visit: Payer: Self-pay

## 2024-01-29 ENCOUNTER — Other Ambulatory Visit (HOSPITAL_BASED_OUTPATIENT_CLINIC_OR_DEPARTMENT_OTHER): Payer: Self-pay

## 2024-01-30 ENCOUNTER — Other Ambulatory Visit (HOSPITAL_BASED_OUTPATIENT_CLINIC_OR_DEPARTMENT_OTHER): Payer: Self-pay

## 2024-01-31 ENCOUNTER — Other Ambulatory Visit (HOSPITAL_BASED_OUTPATIENT_CLINIC_OR_DEPARTMENT_OTHER): Payer: Self-pay

## 2024-01-31 ENCOUNTER — Telehealth: Payer: Self-pay

## 2024-01-31 ENCOUNTER — Other Ambulatory Visit: Payer: Self-pay

## 2024-01-31 NOTE — Telephone Encounter (Signed)
 Copied from CRM [id number]. Topic: Clinical - Prescription Issue >> Jan 31, 2024  2:21 PM Truddie Crumble wrote: Reason for CRM: patient wife called stating the patient need prior authorization for the quantity on the medicine amphetamine-dextroamphetamine  CB [phone]

## 2024-02-03 ENCOUNTER — Telehealth: Payer: Self-pay

## 2024-02-03 ENCOUNTER — Other Ambulatory Visit (HOSPITAL_BASED_OUTPATIENT_CLINIC_OR_DEPARTMENT_OTHER): Payer: Self-pay

## 2024-02-03 ENCOUNTER — Other Ambulatory Visit (HOSPITAL_COMMUNITY): Payer: Self-pay

## 2024-02-03 ENCOUNTER — Encounter (HOSPITAL_BASED_OUTPATIENT_CLINIC_OR_DEPARTMENT_OTHER): Payer: Self-pay

## 2024-02-03 NOTE — Telephone Encounter (Signed)
 Pharmacy Patient Advocate Encounter   Received notification from Pt Calls Messages that prior authorization for Amphetamine-Dextroamphet ER 30MG  er capsules is required/requested.   Insurance verification completed.   The patient is insured through [facility] .   Per test claim: PA required and submitted KEY/EOC/Request #: [id number] APPROVED from 02/03/24 to 02/02/27. Ran test claim, Copay is $20. This test claim was processed through [facility]- copay amounts may vary at other pharmacies due to pharmacy/plan contracts, or as the patient moves through the different stages of their insurance plan.   Per test claim: Maximum day supply of 30

## 2024-02-03 NOTE — Telephone Encounter (Signed)
 PA request has been Approved. New Encounter has been or will be created for follow up. For additional info see Pharmacy Prior Auth telephone encounter from 02/03/24.

## 2024-02-04 ENCOUNTER — Other Ambulatory Visit (HOSPITAL_BASED_OUTPATIENT_CLINIC_OR_DEPARTMENT_OTHER): Payer: Self-pay

## 2024-02-04 ENCOUNTER — Other Ambulatory Visit (HOSPITAL_COMMUNITY): Payer: Self-pay

## 2024-03-03 ENCOUNTER — Other Ambulatory Visit (HOSPITAL_BASED_OUTPATIENT_CLINIC_OR_DEPARTMENT_OTHER): Payer: Self-pay

## 2024-03-03 ENCOUNTER — Other Ambulatory Visit: Payer: Self-pay | Admitting: Family Medicine

## 2024-03-04 ENCOUNTER — Other Ambulatory Visit (HOSPITAL_BASED_OUTPATIENT_CLINIC_OR_DEPARTMENT_OTHER): Payer: Self-pay

## 2024-03-04 NOTE — Telephone Encounter (Signed)
 please send 1 month supply at a time; insurance will not pay for more than 30 days

## 2024-03-05 ENCOUNTER — Other Ambulatory Visit (HOSPITAL_BASED_OUTPATIENT_CLINIC_OR_DEPARTMENT_OTHER): Payer: Self-pay

## 2024-03-05 MED ORDER — AMPHETAMINE-DEXTROAMPHET ER 30 MG PO CP24
30.0000 mg | ORAL_CAPSULE | Freq: Two times a day (BID) | ORAL | 0 refills | Status: DC
  Filled 2024-03-05: qty 60, 30d supply, fill #0

## 2024-03-05 NOTE — Telephone Encounter (Signed)
 Done

## 2024-03-31 ENCOUNTER — Other Ambulatory Visit: Payer: Self-pay | Admitting: Family Medicine

## 2024-03-31 ENCOUNTER — Other Ambulatory Visit: Payer: Self-pay

## 2024-04-01 ENCOUNTER — Other Ambulatory Visit (HOSPITAL_BASED_OUTPATIENT_CLINIC_OR_DEPARTMENT_OTHER): Payer: Self-pay

## 2024-04-01 MED ORDER — AMPHETAMINE-DEXTROAMPHET ER 30 MG PO CP24
30.0000 mg | ORAL_CAPSULE | Freq: Two times a day (BID) | ORAL | 0 refills | Status: DC
Start: 2024-04-01 — End: 2024-04-06
  Filled 2024-04-01: qty 180, 90d supply, fill #0
  Filled 2024-04-06 (×2): qty 60, 30d supply, fill #0

## 2024-04-01 MED ORDER — LEVOTHYROXINE SODIUM 50 MCG PO TABS
50.0000 ug | ORAL_TABLET | Freq: Every day | ORAL | 0 refills | Status: DC
  Filled 2024-04-01: qty 30, 30d supply, fill #0

## 2024-04-01 MED ORDER — LEVOTHYROXINE SODIUM 50 MCG PO TABS
50.0000 ug | ORAL_TABLET | Freq: Every day | ORAL | 3 refills | Status: AC
  Filled 2024-04-06: qty 30, 30d supply, fill #0
  Filled 2024-05-12: qty 30, 30d supply, fill #1
  Filled 2024-05-27 – 2024-06-08 (×2): qty 30, 30d supply, fill #2
  Filled 2024-07-09: qty 30, 30d supply, fill #3
  Filled 2024-08-22: qty 30, 30d supply, fill #4
  Filled 2024-09-20: qty 30, 30d supply, fill #5
  Filled 2024-10-27: qty 30, 30d supply, fill #6
  Filled 2024-11-30: qty 30, 30d supply, fill #7

## 2024-04-01 NOTE — Telephone Encounter (Signed)
 Done

## 2024-04-06 ENCOUNTER — Other Ambulatory Visit (HOSPITAL_BASED_OUTPATIENT_CLINIC_OR_DEPARTMENT_OTHER): Payer: Self-pay

## 2024-04-06 ENCOUNTER — Telehealth: Payer: Self-pay | Admitting: Family Medicine

## 2024-04-06 MED ORDER — AMPHETAMINE-DEXTROAMPHET ER 30 MG PO CP24
30.0000 mg | ORAL_CAPSULE | Freq: Two times a day (BID) | ORAL | 0 refills | Status: DC
  Filled 2024-06-16: qty 60, 30d supply, fill #0

## 2024-04-06 MED ORDER — AMPHETAMINE-DEXTROAMPHET ER 30 MG PO CP24
30.0000 mg | ORAL_CAPSULE | Freq: Two times a day (BID) | ORAL | 0 refills | Status: DC
  Filled 2024-04-06 – 2024-05-11 (×4): qty 60, 30d supply, fill #0

## 2024-04-06 NOTE — Telephone Encounter (Signed)
 His insurance will only pay for a 30 day supply at a time

## 2024-04-10 ENCOUNTER — Other Ambulatory Visit (HOSPITAL_BASED_OUTPATIENT_CLINIC_OR_DEPARTMENT_OTHER): Payer: Self-pay

## 2024-04-10 ENCOUNTER — Telehealth: Payer: Self-pay | Admitting: Family Medicine

## 2024-04-10 MED ORDER — AMPHETAMINE-DEXTROAMPHET ER 30 MG PO CP24
30.0000 mg | ORAL_CAPSULE | Freq: Two times a day (BID) | ORAL | 0 refills | Status: DC
  Filled 2024-04-10: qty 60, 30d supply, fill #0

## 2024-04-10 NOTE — Telephone Encounter (Signed)
 Done

## 2024-04-10 NOTE — Telephone Encounter (Signed)
 Pt changed his insurance, so they're requesting a new prescription be sent for:   -amphetamine -dextroamphetamine  (ADDERALL  XR) 30 MG 24 hr capsule  With new insurance, pharmacy can not fill 90 days and it can only be filled for thirty days.      Message routed appropriately.

## 2024-04-10 NOTE — Telephone Encounter (Signed)
 Copied from CRM [id number]. Topic: Clinical - Medication Refill >> Apr 10, 2024 11:25 AM Howard Macho wrote: Medication: amphetamine -dextroamphetamine  (ADDERALL  XR) 30 MG 24 hr capsule( patient changed insurance so the pharmacy can not fill 90 days and it can only be filled for thirty days. Patient wife stated the pharmacy sent a request over a week ago)  Has the patient contacted their pharmacy? Yes (Agent: If no, request that the patient contact the pharmacy for the refill. If patient does not wish to contact the pharmacy document the reason why and proceed with request.) (Agent: If yes, when and what did the pharmacy advise?)  This is the patient's preferred pharmacy:  [facility] [place] [facility] [address] [place] [address] Phone: [phone] Fax: [phone]  Is this the correct pharmacy for this prescription? Yes If no, delete pharmacy and type the correct one.   Has the prescription been filled recently? yes  Is the patient out of the medication? Yes  Has the patient been seen for an appointment in the last year OR does the patient have an upcoming appointment? Yes  Can we respond through MyChart? Yes  Agent: Please be advised that Rx refills may take up to 3 business days. We ask that you follow-up with your pharmacy.

## 2024-05-11 ENCOUNTER — Other Ambulatory Visit (HOSPITAL_BASED_OUTPATIENT_CLINIC_OR_DEPARTMENT_OTHER): Payer: Self-pay

## 2024-05-27 ENCOUNTER — Other Ambulatory Visit (HOSPITAL_BASED_OUTPATIENT_CLINIC_OR_DEPARTMENT_OTHER): Payer: Self-pay

## 2024-06-16 ENCOUNTER — Other Ambulatory Visit: Payer: Self-pay

## 2024-06-16 ENCOUNTER — Other Ambulatory Visit (HOSPITAL_BASED_OUTPATIENT_CLINIC_OR_DEPARTMENT_OTHER): Payer: Self-pay

## 2024-06-17 ENCOUNTER — Other Ambulatory Visit (HOSPITAL_BASED_OUTPATIENT_CLINIC_OR_DEPARTMENT_OTHER): Payer: Self-pay

## 2024-07-20 ENCOUNTER — Other Ambulatory Visit: Payer: Self-pay

## 2024-07-20 ENCOUNTER — Other Ambulatory Visit: Payer: Self-pay | Admitting: Family Medicine

## 2024-07-21 ENCOUNTER — Other Ambulatory Visit (HOSPITAL_BASED_OUTPATIENT_CLINIC_OR_DEPARTMENT_OTHER): Payer: Self-pay

## 2024-07-21 MED ORDER — AMPHETAMINE-DEXTROAMPHET ER 30 MG PO CP24
30.0000 mg | ORAL_CAPSULE | Freq: Two times a day (BID) | ORAL | 0 refills | Status: DC
  Filled 2024-07-21: qty 60, 30d supply, fill #0

## 2024-07-21 MED ORDER — AMPHETAMINE-DEXTROAMPHET ER 30 MG PO CP24
30.0000 mg | ORAL_CAPSULE | Freq: Two times a day (BID) | ORAL | 0 refills | Status: AC
  Filled 2024-07-21 – 2024-08-26 (×2): qty 60, 30d supply, fill #0

## 2024-07-21 MED ORDER — AMPHETAMINE-DEXTROAMPHET ER 30 MG PO CP24
30.0000 mg | ORAL_CAPSULE | Freq: Two times a day (BID) | ORAL | 0 refills | Status: DC
  Filled 2024-07-21 – 2024-08-22 (×2): qty 60, 30d supply, fill #0

## 2024-07-21 NOTE — Telephone Encounter (Signed)
 Done

## 2024-07-22 ENCOUNTER — Other Ambulatory Visit (HOSPITAL_BASED_OUTPATIENT_CLINIC_OR_DEPARTMENT_OTHER): Payer: Self-pay

## 2024-08-23 ENCOUNTER — Other Ambulatory Visit (HOSPITAL_BASED_OUTPATIENT_CLINIC_OR_DEPARTMENT_OTHER): Payer: Self-pay

## 2024-08-24 ENCOUNTER — Other Ambulatory Visit: Payer: Self-pay

## 2024-08-26 ENCOUNTER — Other Ambulatory Visit (HOSPITAL_BASED_OUTPATIENT_CLINIC_OR_DEPARTMENT_OTHER): Payer: Self-pay

## 2024-09-07 ENCOUNTER — Encounter: Payer: Self-pay | Admitting: Radiology

## 2024-09-11 ENCOUNTER — Other Ambulatory Visit (HOSPITAL_BASED_OUTPATIENT_CLINIC_OR_DEPARTMENT_OTHER): Payer: Self-pay

## 2024-09-17 ENCOUNTER — Other Ambulatory Visit (HOSPITAL_BASED_OUTPATIENT_CLINIC_OR_DEPARTMENT_OTHER): Payer: Self-pay

## 2024-09-20 ENCOUNTER — Other Ambulatory Visit: Payer: Self-pay | Admitting: Family Medicine

## 2024-09-21 ENCOUNTER — Other Ambulatory Visit: Payer: Self-pay

## 2024-09-24 ENCOUNTER — Other Ambulatory Visit (HOSPITAL_BASED_OUTPATIENT_CLINIC_OR_DEPARTMENT_OTHER): Payer: Self-pay

## 2024-09-24 MED ORDER — AMPHETAMINE-DEXTROAMPHET ER 30 MG PO CP24
30.0000 mg | ORAL_CAPSULE | Freq: Two times a day (BID) | ORAL | 0 refills | Status: AC
  Filled 2024-09-24 – 2024-11-04 (×2): qty 60, 30d supply, fill #0

## 2024-09-24 MED ORDER — AMPHETAMINE-DEXTROAMPHET ER 30 MG PO CP24
30.0000 mg | ORAL_CAPSULE | Freq: Two times a day (BID) | ORAL | 0 refills | Status: AC
  Filled 2024-09-24: qty 60, 30d supply, fill #0
  Filled 2024-12-07: qty 20, 10d supply, fill #0

## 2024-09-24 MED ORDER — AMPHETAMINE-DEXTROAMPHET ER 30 MG PO CP24
30.0000 mg | ORAL_CAPSULE | Freq: Two times a day (BID) | ORAL | 0 refills | Status: AC
  Filled 2024-09-24: qty 60, 30d supply, fill #0

## 2024-09-24 NOTE — Telephone Encounter (Signed)
 Done

## 2024-10-13 ENCOUNTER — Other Ambulatory Visit (HOSPITAL_BASED_OUTPATIENT_CLINIC_OR_DEPARTMENT_OTHER): Payer: Self-pay

## 2024-10-26 ENCOUNTER — Other Ambulatory Visit: Payer: Self-pay | Admitting: Family Medicine

## 2024-10-26 ENCOUNTER — Other Ambulatory Visit (HOSPITAL_BASED_OUTPATIENT_CLINIC_OR_DEPARTMENT_OTHER): Payer: Self-pay

## 2024-10-26 MED ORDER — MELOXICAM 15 MG PO TABS
15.0000 mg | ORAL_TABLET | Freq: Every day | ORAL | 3 refills | Status: AC
  Filled 2024-10-26: qty 30, 30d supply, fill #0
  Filled 2024-11-30: qty 30, 30d supply, fill #1

## 2024-10-27 ENCOUNTER — Other Ambulatory Visit: Payer: Self-pay

## 2024-10-27 ENCOUNTER — Encounter: Payer: Self-pay | Admitting: Family Medicine

## 2024-11-02 ENCOUNTER — Other Ambulatory Visit: Payer: Self-pay | Admitting: Family Medicine

## 2024-11-02 ENCOUNTER — Other Ambulatory Visit (HOSPITAL_BASED_OUTPATIENT_CLINIC_OR_DEPARTMENT_OTHER): Payer: Self-pay

## 2024-11-03 ENCOUNTER — Other Ambulatory Visit (HOSPITAL_BASED_OUTPATIENT_CLINIC_OR_DEPARTMENT_OTHER): Payer: Self-pay

## 2024-11-03 ENCOUNTER — Telehealth: Payer: Self-pay

## 2024-11-03 NOTE — Telephone Encounter (Signed)
 Copied from CRM [id number]. Topic: Clinical - Prescription Issue >> Nov 03, 2024  4:07 PM Viola F wrote: Reason for CRM: Patrick Brock from [facility] called regarding patients levothyroxine  (SYNTHROID ) 50 MCG tablet - needs Dr. Mira approval to switch medication to accord brand due to the neurotherapy indicator. Please call him at [phone]

## 2024-11-04 ENCOUNTER — Other Ambulatory Visit (HOSPITAL_COMMUNITY): Payer: Self-pay

## 2024-11-04 ENCOUNTER — Other Ambulatory Visit (HOSPITAL_BASED_OUTPATIENT_CLINIC_OR_DEPARTMENT_OTHER): Payer: Self-pay

## 2024-11-04 NOTE — Telephone Encounter (Signed)
 Spoke with [facility] pharmacy advised of Dr Johnny approval for this medication voiced understanding

## 2024-11-10 ENCOUNTER — Encounter: Payer: Self-pay | Admitting: Family Medicine

## 2024-11-10 ENCOUNTER — Other Ambulatory Visit (HOSPITAL_BASED_OUTPATIENT_CLINIC_OR_DEPARTMENT_OTHER): Payer: Self-pay

## 2024-11-10 ENCOUNTER — Encounter (HOSPITAL_BASED_OUTPATIENT_CLINIC_OR_DEPARTMENT_OTHER): Payer: Self-pay

## 2024-11-30 ENCOUNTER — Other Ambulatory Visit: Payer: Self-pay

## 2024-12-07 ENCOUNTER — Other Ambulatory Visit (HOSPITAL_BASED_OUTPATIENT_CLINIC_OR_DEPARTMENT_OTHER): Payer: Self-pay

## 2024-12-07 ENCOUNTER — Encounter (HOSPITAL_BASED_OUTPATIENT_CLINIC_OR_DEPARTMENT_OTHER): Payer: Self-pay | Admitting: Pharmacist

## 2024-12-17 ENCOUNTER — Encounter: Admitting: Family Medicine
